# Patient Record
Sex: Female | Born: 1975 | Race: White | Hispanic: No | Marital: Married | Smoking: Former smoker
Health system: Southern US, Community
[De-identification: ages and names within clinical notes are randomized; demographics above are authoritative.]

## PROBLEM LIST (undated history)

## (undated) DIAGNOSIS — R011 Cardiac murmur, unspecified: Secondary | ICD-10-CM

## (undated) DIAGNOSIS — K219 Gastro-esophageal reflux disease without esophagitis: Secondary | ICD-10-CM

## (undated) DIAGNOSIS — Z803 Family history of malignant neoplasm of breast: Secondary | ICD-10-CM

## (undated) DIAGNOSIS — O24419 Gestational diabetes mellitus in pregnancy, unspecified control: Secondary | ICD-10-CM

## (undated) HISTORY — DX: Family history of malignant neoplasm of breast: Z80.3

## (undated) HISTORY — PX: WISDOM TOOTH EXTRACTION: SHX21

## (undated) HISTORY — DX: Cardiac murmur, unspecified: R01.1

## (undated) HISTORY — PX: EXPLORATORY LAPAROTOMY: SUR591

## (undated) HISTORY — PX: CHOLECYSTECTOMY: SHX55

## (undated) HISTORY — DX: Gastro-esophageal reflux disease without esophagitis: K21.9

---

## 1997-07-12 ENCOUNTER — Encounter: Admission: RE | Admit: 1997-07-12 | Discharge: 1997-07-12 | Payer: Self-pay | Admitting: Obstetrics & Gynecology

## 1999-06-25 ENCOUNTER — Ambulatory Visit (HOSPITAL_COMMUNITY): Admission: RE | Admit: 1999-06-25 | Discharge: 1999-06-25 | Payer: Self-pay | Admitting: Family Medicine

## 1999-06-25 ENCOUNTER — Encounter: Payer: Self-pay | Admitting: Family Medicine

## 2000-02-19 ENCOUNTER — Other Ambulatory Visit: Admission: RE | Admit: 2000-02-19 | Discharge: 2000-02-19 | Payer: Self-pay | Admitting: *Deleted

## 2001-03-05 ENCOUNTER — Other Ambulatory Visit: Admission: RE | Admit: 2001-03-05 | Discharge: 2001-03-05 | Payer: Self-pay | Admitting: Family Medicine

## 2002-02-08 ENCOUNTER — Encounter: Payer: Self-pay | Admitting: Emergency Medicine

## 2002-02-08 ENCOUNTER — Emergency Department (HOSPITAL_COMMUNITY): Admission: EM | Admit: 2002-02-08 | Discharge: 2002-02-08 | Payer: Self-pay | Admitting: Emergency Medicine

## 2002-03-09 ENCOUNTER — Other Ambulatory Visit: Admission: RE | Admit: 2002-03-09 | Discharge: 2002-03-09 | Payer: Self-pay | Admitting: Family Medicine

## 2002-03-16 ENCOUNTER — Ambulatory Visit (HOSPITAL_COMMUNITY): Admission: RE | Admit: 2002-03-16 | Discharge: 2002-03-16 | Payer: Self-pay | Admitting: Gastroenterology

## 2002-03-29 ENCOUNTER — Ambulatory Visit (HOSPITAL_COMMUNITY): Admission: RE | Admit: 2002-03-29 | Discharge: 2002-03-29 | Payer: Self-pay | Admitting: Gastroenterology

## 2002-03-29 ENCOUNTER — Encounter: Payer: Self-pay | Admitting: Gastroenterology

## 2002-04-12 ENCOUNTER — Emergency Department (HOSPITAL_COMMUNITY): Admission: EM | Admit: 2002-04-12 | Discharge: 2002-04-12 | Payer: Self-pay

## 2003-04-08 ENCOUNTER — Other Ambulatory Visit: Admission: RE | Admit: 2003-04-08 | Discharge: 2003-04-08 | Payer: Self-pay | Admitting: Family Medicine

## 2003-07-12 ENCOUNTER — Other Ambulatory Visit: Admission: RE | Admit: 2003-07-12 | Discharge: 2003-07-12 | Payer: Self-pay | Admitting: Family Medicine

## 2004-04-16 ENCOUNTER — Ambulatory Visit (HOSPITAL_COMMUNITY): Admission: RE | Admit: 2004-04-16 | Discharge: 2004-04-16 | Payer: Self-pay | Admitting: Obstetrics

## 2004-05-13 ENCOUNTER — Emergency Department (HOSPITAL_COMMUNITY): Admission: EM | Admit: 2004-05-13 | Discharge: 2004-05-14 | Payer: Self-pay | Admitting: Emergency Medicine

## 2005-03-26 ENCOUNTER — Ambulatory Visit (HOSPITAL_COMMUNITY): Admission: RE | Admit: 2005-03-26 | Discharge: 2005-03-26 | Payer: Self-pay | Admitting: Gastroenterology

## 2005-10-19 ENCOUNTER — Emergency Department (HOSPITAL_COMMUNITY): Admission: EM | Admit: 2005-10-19 | Discharge: 2005-10-19 | Payer: Self-pay | Admitting: Emergency Medicine

## 2005-12-03 ENCOUNTER — Emergency Department (HOSPITAL_COMMUNITY): Admission: EM | Admit: 2005-12-03 | Discharge: 2005-12-03 | Payer: Self-pay | Admitting: Family Medicine

## 2006-04-22 ENCOUNTER — Emergency Department (HOSPITAL_COMMUNITY): Admission: EM | Admit: 2006-04-22 | Discharge: 2006-04-22 | Payer: Self-pay | Admitting: Family Medicine

## 2006-06-17 ENCOUNTER — Emergency Department (HOSPITAL_COMMUNITY): Admission: EM | Admit: 2006-06-17 | Discharge: 2006-06-17 | Payer: Self-pay | Admitting: Family Medicine

## 2006-10-29 ENCOUNTER — Emergency Department (HOSPITAL_COMMUNITY): Admission: EM | Admit: 2006-10-29 | Discharge: 2006-10-29 | Payer: Self-pay | Admitting: Family Medicine

## 2006-11-12 ENCOUNTER — Ambulatory Visit (HOSPITAL_COMMUNITY): Admission: RE | Admit: 2006-11-12 | Discharge: 2006-11-12 | Payer: Self-pay | Admitting: Gastroenterology

## 2006-12-05 ENCOUNTER — Ambulatory Visit (HOSPITAL_COMMUNITY): Admission: RE | Admit: 2006-12-05 | Discharge: 2006-12-05 | Payer: Self-pay | Admitting: General Surgery

## 2006-12-05 ENCOUNTER — Encounter (INDEPENDENT_AMBULATORY_CARE_PROVIDER_SITE_OTHER): Payer: Self-pay | Admitting: General Surgery

## 2006-12-30 ENCOUNTER — Ambulatory Visit (HOSPITAL_COMMUNITY): Admission: RE | Admit: 2006-12-30 | Discharge: 2006-12-30 | Payer: Self-pay | Admitting: Gynecology

## 2008-02-09 ENCOUNTER — Other Ambulatory Visit: Admission: RE | Admit: 2008-02-09 | Discharge: 2008-02-09 | Payer: Self-pay | Admitting: Gynecology

## 2009-02-01 ENCOUNTER — Emergency Department (HOSPITAL_COMMUNITY): Admission: EM | Admit: 2009-02-01 | Discharge: 2009-02-01 | Payer: Self-pay | Admitting: Family Medicine

## 2010-06-19 NOTE — Op Note (Signed)
NAMECHABELI, Tiffany Paul NO.:  [id number]   MEDICAL RECORD NO.:  [id number]          PATIENT TYPE:  AMB   LOCATION:  DAY                          FACILITY:  [facility]   PHYSICIAN:  Lennie Muckle, MD      DATE OF BIRTH:  31-Dec-1975   DATE OF PROCEDURE:  12/05/2006  DATE OF DISCHARGE:                               OPERATIVE REPORT   PROCEDURE:  Laparoscopic cholecystectomy.   SURGEON:  Lennie Muckle, M.D.   ASSISTANT:  Adolph Pollack, M.D.   ANESTHESIA:  General endotracheal.   SPECIMENS:  Gallbladder.   COMPLICATIONS:  None immediate.   ESTIMATED BLOOD LOSS:  Minimal.   INDICATIONS FOR PROCEDURE:  The patient is a 35 year old female who had  epigastric discomfort which was associated with eating and also had  associated nausea and vomiting.  Ultrasound revealed gallstones and the  diagnosis of symptomatic cholelithiasis was obtained.  Informed consent  was obtained for a laparoscopic cholecystectomy.   DESCRIPTION OF PROCEDURE:  The patient was identified in the  preoperative holding area, taken back to the operating room, where she  was placed in supine position.  She was given IV antibiotics with Cipro  prior to the procedure.  After administration of general endotracheal  anesthesia her abdomen was prepped and draped in the usual sterile  fashion.  A timeout procedure was performed indicating the patient and  the procedure.  The supraumbilical area skin was anesthetized with 0.25%  Marcaine as well as 1% lidocaine mixture.  Incision placed.  The fascia  was grasped with a Kocher.  The Veress needle was placed into the  abdominal cavity to obtain pneumoperitoneum.  Once pneumoperitoneum was  established, an 11 mm trocar was placed in the supraumbilical incision  site under direct visualization with the OptiVu.  The abdominal wall  layers of the rectus muscle, and the fascia anterior and posterior were  identified.  Once inside the abdominal cavity, it was  evident that there  was no evidence of injury to the small bowel.  A 5 mm trocar was placed  in the epigastric region and two were placed along the right  costochondral margin under visualization of the camera.  The fundus of  the gallbladder was retracted toward the head of the patient.  The  infundibulum was grasped away from the liver bed and the peritoneum  dissected with the [place] forceps.  Using both electrocautery and the  [place] forceps, the cystic duct was able to be fully identified.  A  critical view was obtained of the liver bed as well as the cystic duct.  Three clips were placed proximally and one distally on the cystic duct.  This was then transected with laparoscopic scissors.  The cystic artery  was identified medially along the gallbladder.  Two clips were placed  proximally on the cystic artery, one distally.  This was also transected  and the remaining peritoneal attachments were taken with the hook  electrocautery.  Electrocautery was applied to the liver bed for  hemostasis.  The abdomen was irrigated with saline.  There was  no  evidence of bleeding at the end of the case and no biliary drainage at  the end of the case.  The specimen was  removed in the EndoCatch bag from the supraumbilical incision.  The  fascia at the supraumbilical incision was closed with 0 Vicryl sutures.  Skin was closed with 4-0 Monocryl.  Steri-Strips were placed as a final  dressing.  The patient was then extubated and transported to the  postanesthesia care unit in stable condition.      Lennie Muckle, MD  Electronically Signed     ALA/MEDQ  D:  12/05/2006  T:  12/06/2006  Job:  [id number]

## 2010-06-19 NOTE — Op Note (Signed)
Tiffany Paul, Tiffany Paul               ACCOUNT NO.:  [id number]   MEDICAL RECORD NO.:  [id number]          PATIENT TYPE:  AMB   LOCATION:  ENDO                         FACILITY:  [facility]   PHYSICIAN:  John C. Madilyn Fireman, M.D.    DATE OF BIRTH:  Jun 30, 1975   DATE OF PROCEDURE:  11/12/2006  DATE OF DISCHARGE:                               OPERATIVE REPORT   PROCEDURE:  Esophagogastroduodenoscopy.   INDICATIONS FOR PROCEDURE:  Epigastric abdominal pain.   PROCEDURE:  The patient was placed in the left lateral decubitus  position and placed on the pulse monitor with continuous low-flow oxygen  delivered by nasal cannula.  She was sedated with 100 mcg IV fentanyl  and 10 mg IV Versed.  The Olympus video endoscope was advanced under  direct vision into the oropharynx and esophagus.  Esophagus was straight  and of normal caliber with the squamocolumnar line at 38 cm.  There was  no visible hiatal hernia, ring, stricture or other abnormality of the GE  junction.  The stomach was entered and a small amount of liquid  secretions were suctioned from the fundus.  Retroflexed view of the  cardia was unremarkable.  The fundus, body and the pylorus all appeared  normal.  The duodenum was entered and  both bulb and second portion were  well inspected and appeared to be within normal limits.  The scope was  then withdrawn and the patient returned to the recovery room in stable  condition.  He tolerated the procedure well.  There were no immediate  complications.   IMPRESSION:  Normal study.   PLAN:  Will proceed with gallbladder ultrasound.           ______________________________  Everardo All Madilyn Fireman, M.D.     JCH/MEDQ  D:  11/12/2006  T:  11/13/2006  Job:  [id number]   cc:   Leatha Gilding. Mezer, M.D.  Fax: [phone]

## 2010-06-22 NOTE — Op Note (Signed)
   [name], Tiffany Paul                          ACCOUNT NO.:  [id number]   MEDICAL RECORD NO.:  [id number]                   PATIENT TYPE:  OUT   LOCATION:  ENDO                                 FACILITY:  [facility]   PHYSICIAN:  John C. Madilyn Fireman, M.D.                 DATE OF BIRTH:  [birth year]   DATE OF PROCEDURE:  03/16/2002  DATE OF DISCHARGE:                                 OPERATIVE REPORT   PROCEDURE:  Esophagogastroduodenoscopy.   INDICATIONS FOR PROCEDURE:  Continued epigastric abdominal pain with  question of a biliary source based on mildly thickened gallbladder wall on a  previous ultrasound. Subsequent PIPIDA scan was unrevealing. She has had no  consistent response to the proton pump inhibitors. The procedure is to  assess for any acid peptic disease, significant hiatal hernia or  helicobacter infection possibly contributing to her symptoms.   DESCRIPTION OF PROCEDURE:  The patient was placed in the left lateral  decubitus position then placed on the pulse monitor with continuous low flow  oxygen delivered by nasal cannula. She was sedated with 75 mcg IV fentanyl  and 9 mg IV Versed. The Olympus video endoscope was advanced under direct  vision into the oropharynx and esophagus. The esophagus was straight and of  normal caliber with the squamocolumnar line at 38 cm. There was no visible  hiatal hernia, ring, stricture or other abnormality of the GE junction. The  stomach was entered and a small amount of liquid secretions were suctioned  from the fundus. Retroflexed view of the cardia was unremarkable. The  fundus, body, antrum and pylorus all appeared normal. The duodenum was  entered and both the bulb and second portion were well inspected and  appeared to be within normal limits. The scope was withdrawn back into the  stomach and a CLOtest obtained. The scope was then withdrawn and the patient  returned to the recovery room in stable condition. She tolerated the  procedure  well and there were no immediate complications.   IMPRESSION:  Normal endoscopy.   PLAN:  Await CLOtest, otherwise, if future abdominal pain attacks occur  consider repeat ultrasound or CCK stimulated PIPIDA scan.                                               John C. Madilyn Fireman, M.D.    JCH/MEDQ  D:  03/16/2002  T:  03/16/2002  Job:  [id number]

## 2010-10-26 LAB — RPR: RPR: NONREACTIVE

## 2010-10-26 LAB — GC/CHLAMYDIA PROBE AMP, GENITAL
Chlamydia: NEGATIVE
Gonorrhea: NEGATIVE

## 2010-10-26 LAB — HEPATITIS B SURFACE ANTIGEN: Hepatitis B Surface Ag: NEGATIVE

## 2010-10-26 LAB — HIV ANTIBODY (ROUTINE TESTING W REFLEX): HIV: NONREACTIVE

## 2010-10-26 LAB — ANTIBODY SCREEN: Antibody Screen: NEGATIVE

## 2010-10-26 LAB — RUBELLA ANTIBODY, IGM: Rubella: IMMUNE

## 2010-11-12 ENCOUNTER — Encounter (HOSPITAL_COMMUNITY): Payer: Self-pay | Admitting: *Deleted

## 2010-11-14 LAB — HEMOGLOBIN AND HEMATOCRIT, BLOOD
HCT: 37.4
Hemoglobin: 12.9

## 2010-11-14 LAB — PREGNANCY, URINE: Preg Test, Ur: NEGATIVE

## 2010-11-15 LAB — COMPREHENSIVE METABOLIC PANEL
Alkaline Phosphatase: 64
BUN: 15
GFR calc Af Amer: >60
GFR calc non Af Amer: >60
Total Bilirubin: 0.9

## 2010-11-15 LAB — POCT PREGNANCY, URINE
Operator id: 247071
Preg Test, Ur: NEGATIVE

## 2010-11-15 LAB — DIFFERENTIAL
Basophils Absolute: 0.2 — ABNORMAL HIGH
Basophils Relative: 2 — ABNORMAL HIGH
Eosinophils Relative: 1
Lymphs Abs: 3.2
Neutro Abs: 5.8
Neutrophils Relative %: 59

## 2010-11-15 LAB — CBC
HCT: 41.9
MCHC: 34
MCV: 86.9
RBC: 4.82
RDW: 12.9

## 2010-11-20 ENCOUNTER — Encounter (HOSPITAL_COMMUNITY): Payer: Self-pay | Admitting: *Deleted

## 2011-03-13 ENCOUNTER — Encounter: Payer: BC Managed Care – PPO | Attending: Obstetrics and Gynecology | Admitting: Dietician

## 2011-03-13 DIAGNOSIS — Z713 Dietary counseling and surveillance: Secondary | ICD-10-CM | POA: Insufficient documentation

## 2011-03-13 DIAGNOSIS — O99810 Abnormal glucose complicating pregnancy: Secondary | ICD-10-CM | POA: Insufficient documentation

## 2011-03-13 NOTE — Patient Instructions (Signed)
Goals:  Check glucose levels per MD as instructed  Follow Gestational Diabetes Diet as instructed  Call for follow-up as needed    

## 2011-03-13 NOTE — Progress Notes (Signed)
  Patient was seen on 03/13/2011 for Gestational Diabetes self-management class at the [facility]. The following learning objectives were met by the patient during this course:   States the definition of Gestational Diabetes  States why dietary management is important in controlling blood glucose  Describes the effects each nutrient has on blood glucose levels  Demonstrates ability to create a balanced meal plan  Demonstrates carbohydrate counting   States when to check blood glucose levels  Demonstrates proper blood glucose monitoring techniques  States the effect of stress and exercise on blood glucose levels  States the importance of limiting caffeine and abstaining from alcohol and smoking  Blood glucose monitor given: Accu-Chek Smartview Meter Kit  (Accu-Chek Smartview strips and Accu-Chek Fast Clix lancets) Lot # [id number] Exp: 06/03/2012 Blood glucose reading: 78  Patient instructed to monitor glucose levels: Fasting and 2 hours after first bite of each meal. FBS: 60 - <90 2 hour: <120  Patient received handouts:  Nutrition Diabetes and Pregnancy  Carbohydrate Counting List  Patient will be seen for follow-up as needed.

## 2011-04-24 LAB — STREP B DNA PROBE: GBS: NEGATIVE

## 2011-05-03 ENCOUNTER — Encounter (HOSPITAL_COMMUNITY): Payer: Self-pay | Admitting: *Deleted

## 2011-05-03 ENCOUNTER — Inpatient Hospital Stay (HOSPITAL_COMMUNITY)
Admission: AD | Admit: 2011-05-03 | Discharge: 2011-05-03 | Disposition: A | Discharge status: Home / Self Care | Payer: BC Managed Care – PPO | Source: Ambulatory Visit | Attending: Obstetrics and Gynecology | Admitting: Obstetrics and Gynecology

## 2011-05-03 DIAGNOSIS — O479 False labor, unspecified: Secondary | ICD-10-CM | POA: Insufficient documentation

## 2011-05-03 NOTE — Discharge Instructions (Signed)
Pregnancy - Third Trimester The third trimester of pregnancy (the last 3 months) is a period of the most rapid growth for you and your baby. The baby approaches a length of 20 inches and a weight of 6 to 10 pounds. The baby is adding on fat and getting ready for life outside your body. While inside, babies have periods of sleeping and waking, suck their thumbs, and hiccups. You can often feel small contractions of the uterus. This is false labor. It is also called Braxton-Hicks contractions. This is like a practice for labor. The usual problems in this stage of pregnancy include more difficulty breathing, swelling of the hands and feet from water retention, and having to urinate more often because of the uterus and baby pressing on your bladder.  PRENATAL EXAMS  Blood work may continue to be done during prenatal exams. These tests are done to check on your health and the probable health of your baby. Blood work is used to follow your blood levels (hemoglobin). Anemia (low hemoglobin) is common during pregnancy. Iron and vitamins are given to help prevent this. You may also continue to be checked for diabetes. Some of the past blood tests may be done again.   The size of the uterus is measured during each visit. This makes sure your baby is growing properly according to your pregnancy dates.   Your blood pressure is checked every prenatal visit. This is to make sure you are not getting toxemia.   Your urine is checked every prenatal visit for infection, diabetes and protein.   Your weight is checked at each visit. This is done to make sure gains are happening at the suggested rate and that you and your baby are growing normally.   Sometimes, an ultrasound is performed to confirm the position and the proper growth and development of the baby. This is a test done that bounces harmless sound waves off the baby so your caregiver can more accurately determine due dates.   Discuss the type of pain  medication and anesthesia you will have during your labor and delivery.   Discuss the possibility and anesthesia if a Cesarean Section might be necessary.   Inform your caregiver if there is any mental or physical violence at home.  Sometimes, a specialized non-stress test, contraction stress test and biophysical profile are done to make sure the baby is not having a problem. Checking the amniotic fluid surrounding the baby is called an amniocentesis. The amniotic fluid is removed by sticking a needle into the belly (abdomen). This is sometimes done near the end of pregnancy if an early delivery is required. In this case, it is done to help make sure the baby's lungs are mature enough for the baby to live outside of the womb. If the lungs are not mature and it is unsafe to deliver the baby, an injection of cortisone medication is given to the mother 1 to 2 days before the delivery. This helps the baby's lungs mature and makes it safer to deliver the baby. CHANGES OCCURING IN THE THIRD TRIMESTER OF PREGNANCY Your body goes through many changes during pregnancy. They vary from person to person. Talk to your caregiver about changes you notice and are concerned about.  During the last trimester, you have probably had an increase in your appetite. It is normal to have cravings for certain foods. This varies from person to person and pregnancy to pregnancy.   You may begin to get stretch marks on your hips,   abdomen, and breasts. These are normal changes in the body during pregnancy. There are no exercises or medications to take which prevent this change.   Constipation may be treated with a stool softener or adding bulk to your diet. Drinking lots of fluids, fiber in vegetables, fruits, and whole grains are helpful.   Exercising is also helpful. If you have been very active up until your pregnancy, most of these activities can be continued during your pregnancy. If you have been less active, it is helpful  to start an exercise program such as walking. Consult your caregiver before starting exercise programs.   Avoid all smoking, alcohol, un-prescribed drugs, herbs and "street drugs" during your pregnancy. These chemicals affect the formation and growth of the baby. Avoid chemicals throughout the pregnancy to ensure the delivery of a healthy infant.   Backache, varicose veins and hemorrhoids may develop or get worse.   You will tire more easily in the third trimester, which is normal.   The baby's movements may be stronger and more often.   You may become short of breath easily.   Your belly button may stick out.   A yellow discharge may leak from your breasts called colostrum.   You may have a bloody mucus discharge. This usually occurs a few days to a week before labor begins.  HOME CARE INSTRUCTIONS   Keep your caregiver's appointments. Follow your caregiver's instructions regarding medication use, exercise, and diet.   During pregnancy, you are providing food for you and your baby. Continue to eat regular, well-balanced meals. Choose foods such as meat, fish, milk and other low fat dairy products, vegetables, fruits, and whole-grain breads and cereals. Your caregiver will tell you of the ideal weight gain.   A physical sexual relationship may be continued throughout pregnancy if there are no other problems such as early (premature) leaking of amniotic fluid from the membranes, vaginal bleeding, or belly (abdominal) pain.   Exercise regularly if there are no restrictions. Check with your caregiver if you are unsure of the safety of your exercises. Greater weight gain will occur in the last 2 trimesters of pregnancy. Exercising helps:   Control your weight.   Get you in shape for labor and delivery.   You lose weight after you deliver.   Rest a lot with legs elevated, or as needed for leg cramps or low back pain.   Wear a good support or jogging bra for breast tenderness during  pregnancy. This may help if worn during sleep. Pads or tissues may be used in the bra if you are leaking colostrum.   Do not use hot tubs, steam rooms, or saunas.   Wear your seat belt when driving. This protects you and your baby if you are in an accident.   Avoid raw meat, cat litter boxes and soil used by cats. These carry germs that can cause birth defects in the baby.   It is easier to loose urine during pregnancy. Tightening up and strengthening the pelvic muscles will help with this problem. You can practice stopping your urination while you are going to the bathroom. These are the same muscles you need to strengthen. It is also the muscles you would use if you were trying to stop from passing gas. You can practice tightening these muscles up 10 times a set and repeating this about 3 times per day. Once you know what muscles to tighten up, do not perform these exercises during urination. It is more likely   to cause an infection by backing up the urine.   Ask for help if you have financial, counseling or nutritional needs during pregnancy. Your caregiver will be able to offer counseling for these needs as well as refer you for other special needs.   Make a list of emergency phone numbers and have them available.   Plan on getting help from family or friends when you go home from the hospital.   Make a trial run to the hospital.   Take prenatal classes with the father to understand, practice and ask questions about the labor and delivery.   Prepare the baby's room/nursery.   Do not travel out of the city unless it is absolutely necessary and with the advice of your caregiver.   Wear only low or no heal shoes to have better balance and prevent falling.  MEDICATIONS AND DRUG USE IN PREGNANCY  Take prenatal vitamins as directed. The vitamin should contain 1 milligram of folic acid. Keep all vitamins out of reach of children. Only a couple vitamins or tablets containing iron may be fatal  to a baby or young child when ingested.   Avoid use of all medications, including herbs, over-the-counter medications, not prescribed or suggested by your caregiver. Only take over-the-counter or prescription medicines for pain, discomfort, or fever as directed by your caregiver. Do not use aspirin, ibuprofen (Motrin, Advil, Nuprin) or naproxen (Aleve) unless OK'd by your caregiver.   Let your caregiver also know about herbs you may be using.   Alcohol is related to a number of birth defects. This includes fetal alcohol syndrome. All alcohol, in any form, should be avoided completely. Smoking will cause low birth rate and premature babies.   Street/illegal drugs are very harmful to the baby. They are absolutely forbidden. A baby born to an addicted mother will be addicted at birth. The baby will go through the same withdrawal an adult does.  SEEK MEDICAL CARE IF: You have any concerns or worries during your pregnancy. It is better to call with your questions if you feel they cannot wait, rather than worry about them. DECISIONS ABOUT CIRCUMCISION You may or may not know the sex of your baby. If you know your baby is a boy, it may be time to think about circumcision. Circumcision is the removal of the foreskin of the penis. This is the skin that covers the sensitive end of the penis. There is no proven medical need for this. Often this decision is made on what is popular at the time or based upon religious beliefs and social issues. You can discuss these issues with your caregiver or pediatrician. SEEK IMMEDIATE MEDICAL CARE IF:   An unexplained oral temperature above 102 F (38.9 C) develops, or as your caregiver suggests.   You have leaking of fluid from the vagina (birth canal). If leaking membranes are suspected, take your temperature and tell your caregiver of this when you call.   There is vaginal spotting, bleeding or passing clots. Tell your caregiver of the amount and how many pads are  used.   You develop a bad smelling vaginal discharge with a change in the color from clear to white.   You develop vomiting that lasts more than 24 hours.   You develop chills or fever.   You develop shortness of breath.   You develop burning on urination.   You loose more than 2 pounds of weight or gain more than 2 pounds of weight or as suggested by your   caregiver.   You notice sudden swelling of your face, hands, and feet or legs.   You develop belly (abdominal) pain. Round ligament discomfort is a common non-cancerous (benign) cause of abdominal pain in pregnancy. Your caregiver still must evaluate you.   You develop a severe headache that does not go away.   You develop visual problems, blurred or double vision.   If you have not felt your baby move for more than 1 hour. If you think the baby is not moving as much as usual, eat something with sugar in it and lie down on your left side for an hour. The baby should move at least 4 to 5 times per hour. Call right away if your baby moves less than that.   You fall, are in a car accident or any kind of trauma.   There is mental or physical violence at home.   Keep appointment already scheduled.  Call Private MD and/or return to [facility] if symptoms increase. Document Released: 01/15/2001 Document Revised: 01/10/2011 Document Reviewed: 07/20/2008 [facility] Patient Information 2012 [place], [place].

## 2011-05-03 NOTE — MAU Note (Signed)
Pt states she has been having pain all day.pain increased at about 2100

## 2011-05-03 NOTE — MAU Note (Signed)
Contractions, pressure. Denies bleeding. Gestational diabetes

## 2011-05-05 ENCOUNTER — Encounter (HOSPITAL_COMMUNITY): Payer: Self-pay | Admitting: *Deleted

## 2011-05-05 ENCOUNTER — Inpatient Hospital Stay (HOSPITAL_COMMUNITY)
Admission: AD | Admit: 2011-05-05 | Discharge: 2011-05-05 | Disposition: A | Discharge status: Hospice | Payer: BC Managed Care – PPO | Source: Ambulatory Visit | Attending: Obstetrics and Gynecology | Admitting: Obstetrics and Gynecology

## 2011-05-05 DIAGNOSIS — O479 False labor, unspecified: Secondary | ICD-10-CM | POA: Insufficient documentation

## 2011-05-05 HISTORY — DX: Gestational diabetes mellitus in pregnancy, unspecified control: O24.419

## 2011-05-05 NOTE — MAU Note (Signed)
Pt c/o contractions that have been 5-7 min apart for the past two hours and increasing in intensity.

## 2011-05-05 NOTE — MAU Note (Signed)
Pt G2 at 37.3wks having contractions and leaking small amt of clear fluid since 1500.

## 2011-05-05 NOTE — Discharge Instructions (Signed)

## 2011-05-14 ENCOUNTER — Encounter (HOSPITAL_COMMUNITY): Payer: Self-pay | Admitting: *Deleted

## 2011-05-14 ENCOUNTER — Inpatient Hospital Stay (HOSPITAL_COMMUNITY)
Admission: AD | Admit: 2011-05-14 | Discharge: 2011-05-18 | DRG: 371 | Disposition: A | Discharge status: Home / Self Care | Payer: BC Managed Care – PPO | Source: Ambulatory Visit | Attending: Obstetrics and Gynecology | Admitting: Obstetrics and Gynecology

## 2011-05-14 DIAGNOSIS — O09519 Supervision of elderly primigravida, unspecified trimester: Secondary | ICD-10-CM | POA: Diagnosis present

## 2011-05-14 DIAGNOSIS — O339 Maternal care for disproportion, unspecified: Secondary | ICD-10-CM | POA: Diagnosis present

## 2011-05-14 DIAGNOSIS — O330 Maternal care for disproportion due to deformity of maternal pelvic bones: Principal | ICD-10-CM | POA: Diagnosis present

## 2011-05-14 DIAGNOSIS — O99814 Abnormal glucose complicating childbirth: Secondary | ICD-10-CM | POA: Diagnosis present

## 2011-05-14 LAB — LACTATE DEHYDROGENASE: LDH: 206 U/L (ref 94–250)

## 2011-05-14 LAB — COMPREHENSIVE METABOLIC PANEL
Alkaline Phosphatase: 156 U/L — ABNORMAL HIGH (ref 39–117)
BUN: 14 mg/dL (ref 6–23)
Creatinine, Ser: 0.61 mg/dL (ref 0.50–1.10)
GFR calc Af Amer: >90 mL/min (ref >90)
Glucose, Bld: 68 mg/dL — ABNORMAL LOW (ref 70–99)
Potassium: 3.4 mEq/L — ABNORMAL LOW (ref 3.5–5.1)
Total Bilirubin: 0.5 mg/dL (ref 0.3–1.2)
Total Protein: 5.8 g/dL — ABNORMAL LOW (ref 6.0–8.3)

## 2011-05-14 LAB — CBC
MCV: 91 fL (ref 78.0–100.0)
Platelets: 168 10*3/uL (ref 150–400)
RBC: 4.46 MIL/uL (ref 3.87–5.11)
RDW: 13.9 % (ref 11.5–15.5)
WBC: 14.3 10*3/uL — ABNORMAL HIGH (ref 4.0–10.5)

## 2011-05-14 LAB — GLUCOSE, CAPILLARY: Glucose-Capillary: 67 mg/dL — ABNORMAL LOW (ref 70–99)

## 2011-05-14 MED ORDER — OXYCODONE-ACETAMINOPHEN 5-325 MG PO TABS: 1.0000 | ORAL_TABLET | ORAL | Status: DC | PRN

## 2011-05-14 MED ORDER — IBUPROFEN 600 MG PO TABS: 600.0000 mg | ORAL_TABLET | Freq: Four times a day (QID) | ORAL | Status: DC | PRN

## 2011-05-14 MED ORDER — ACETAMINOPHEN 325 MG PO TABS
650.0000 mg | ORAL_TABLET | ORAL | Status: DC | PRN
  Administered 2011-05-14 – 2011-05-15 (×2): 650 mg via ORAL
  Filled 2011-05-14 (×2): qty 2

## 2011-05-14 MED ORDER — FLEET ENEMA 7-19 GM/118ML RE ENEM: 1.0000 | ENEMA | RECTAL | Status: DC | PRN

## 2011-05-14 MED ORDER — LACTATED RINGERS IV SOLN: 500.0000 mL | INTRAVENOUS | Status: DC | PRN

## 2011-05-14 MED ORDER — ONDANSETRON HCL 4 MG/2ML IJ SOLN
4.0000 mg | Freq: Four times a day (QID) | INTRAMUSCULAR | Status: DC | PRN
  Administered 2011-05-15: 4 mg via INTRAVENOUS
  Filled 2011-05-14: qty 2

## 2011-05-14 MED ORDER — TERBUTALINE SULFATE 1 MG/ML IJ SOLN: 0.2500 mg | Freq: Once | INTRAMUSCULAR | Status: AC | PRN

## 2011-05-14 MED ORDER — OXYTOCIN BOLUS FROM INFUSION
500.0000 mL | Freq: Once | INTRAVENOUS | Status: DC
  Filled 2011-05-14: qty 500

## 2011-05-14 MED ORDER — LACTATED RINGERS IV SOLN
INTRAVENOUS | Status: DC
  Administered 2011-05-15 (×6): via INTRAVENOUS
  Administered 2011-05-15: 500 mL via INTRAVENOUS
  Administered 2011-05-15: 300 mL via INTRAVENOUS

## 2011-05-14 MED ORDER — CITRIC ACID-SODIUM CITRATE 334-500 MG/5ML PO SOLN
30.0000 mL | ORAL | Status: DC | PRN
  Administered 2011-05-15: 30 mL via ORAL
  Filled 2011-05-14: qty 15

## 2011-05-14 MED ORDER — OXYTOCIN 20 UNITS IN LACTATED RINGERS INFUSION - SIMPLE
1.0000 m[IU]/min | INTRAVENOUS | Status: DC
  Administered 2011-05-14: 2 m[IU]/min via INTRAVENOUS
  Filled 2011-05-14 (×2): qty 1000

## 2011-05-14 MED ORDER — OXYTOCIN 20 UNITS IN LACTATED RINGERS INFUSION - SIMPLE: 125.0000 mL/h | Freq: Once | INTRAVENOUS | Status: DC

## 2011-05-14 MED ORDER — LIDOCAINE HCL (PF) 1 % IJ SOLN: 30.0000 mL | INTRAMUSCULAR | Status: DC | PRN

## 2011-05-14 NOTE — MAU Note (Signed)
Labor admission orders entered on pt in L/D per request of Dr. Arelia Sneddon .

## 2011-05-14 NOTE — MAU Note (Signed)
Pt had ? Gush of fluid while on toilet @ 1600, pinkish fluid, continues to have leaking, no frank bleeding, some uc's.

## 2011-05-14 NOTE — MAU Note (Signed)
srom clear fluid,, bloody show, @ 1600 today

## 2011-05-14 NOTE — H&P (Signed)
Tiffany Paul is a 36 y.o. female presenting at 45.5 with srom at 38.5.  Gestational DM controlled by diet.  Neg GBS Maternal Medical History:  Reason for admission: Reason for admission: rupture of membranes.  Contractions: Onset was 1-2 hours ago.   Perceived severity is moderate.    Fetal activity: Perceived fetal activity is normal.    Prenatal Complications - Diabetes: gestational. Diabetes is managed by diet.      OB History    Grav Para Term Preterm Abortions TAB SAB Ect Mult Living   1         0     Past Medical History  Diagnosis Date  . Gestational diabetes   . Endometriosis    Past Surgical History  Procedure Date  . Cholecystectomy   . Exploratory laparotomy     For endometrosis  . Wisdom tooth extraction    Family History: family history includes Cancer in her mother and other; Diabetes in her father; Heart disease in her paternal uncle; and Hypertension in her other and paternal uncle. Social History:  reports that she has never smoked. She has never used smokeless tobacco. She reports that she does not drink alcohol or use illicit drugs.  ROS  Dilation: 3 Effacement (%): 70 Station: -2 Exam by:: Dorrene German RN Blood pressure 140/80, pulse 81, temperature 98.3 F (36.8 C), temperature source Oral, resp. rate 18, height 5\' 4"  (1.626 m), weight 82.555 kg (182 lb), last menstrual period 08/16/2010. Maternal Exam:  Uterine Assessment: Contraction strength is moderate.  Contraction frequency is regular.   Abdomen: Patient reports no abdominal tenderness. Fundal height is cw dates.   Estimated fetal weight is 7.5.   Fetal presentation: vertex  Introitus: Amniotic fluid character: clear.  Pelvis: adequate for delivery.   Cervix: Cervix evaluated by digital exam.     Physical Exam  Prenatal labs: ABO, Rh: A/Positive/-- (09/21 0000) Antibody: Negative (09/21 0000) Rubella: Immune (09/21 0000) RPR: Nonreactive (09/21 0000)  HBsAg: Negative (09/21 0000)    HIV: Non-reactive (09/21 0000)  GBS: Negative (03/20 0000)   Assessment/Plan:IUP at 38.5 with SROM.  Routine L and D  Tiffany Paul S 05/14/2011, 7:44 PM

## 2011-05-15 ENCOUNTER — Encounter (HOSPITAL_COMMUNITY): Payer: Self-pay | Admitting: Anesthesiology

## 2011-05-15 ENCOUNTER — Inpatient Hospital Stay (HOSPITAL_COMMUNITY): Payer: BC Managed Care – PPO

## 2011-05-15 ENCOUNTER — Encounter (HOSPITAL_COMMUNITY)
Admission: AD | Disposition: A | Discharge status: Home / Self Care | Payer: Self-pay | Source: Ambulatory Visit | Attending: Obstetrics and Gynecology

## 2011-05-15 ENCOUNTER — Inpatient Hospital Stay (HOSPITAL_COMMUNITY): Payer: BC Managed Care – PPO | Admitting: Anesthesiology

## 2011-05-15 LAB — RPR: RPR Ser Ql: NONREACTIVE

## 2011-05-15 SURGERY — Surgical Case
Anesthesia: Regional | Site: Abdomen | Wound class: Clean Contaminated

## 2011-05-15 MED ORDER — MORPHINE SULFATE (PF) 0.5 MG/ML IJ SOLN
INTRAMUSCULAR | Status: DC | PRN
  Administered 2011-05-15: 2 mg via EPIDURAL

## 2011-05-15 MED ORDER — MORPHINE SULFATE (PF) 0.5 MG/ML IJ SOLN
INTRAMUSCULAR | Status: DC | PRN
  Administered 2011-05-15: 3 mg via EPIDURAL

## 2011-05-15 MED ORDER — EPHEDRINE 5 MG/ML INJ: 10.0000 mg | INTRAVENOUS | Status: DC | PRN

## 2011-05-15 MED ORDER — MORPHINE SULFATE 0.5 MG/ML IJ SOLN
INTRAMUSCULAR | Status: AC
  Filled 2011-05-15: qty 10

## 2011-05-15 MED ORDER — FENTANYL 2.5 MCG/ML BUPIVACAINE 1/10 % EPIDURAL INFUSION (WH - ANES)
INTRAMUSCULAR | Status: DC | PRN
  Administered 2011-05-15: 14 mL/h via EPIDURAL

## 2011-05-15 MED ORDER — LIDOCAINE-EPINEPHRINE (PF) 2 %-1:200000 IJ SOLN
INTRAMUSCULAR | Status: AC
  Filled 2011-05-15: qty 20

## 2011-05-15 MED ORDER — LACTATED RINGERS IV SOLN: 500.0000 mL | Freq: Once | INTRAVENOUS | Status: DC

## 2011-05-15 MED ORDER — DIPHENHYDRAMINE HCL 50 MG/ML IJ SOLN: 12.5000 mg | INTRAMUSCULAR | Status: DC | PRN

## 2011-05-15 MED ORDER — OXYTOCIN 10 UNIT/ML IJ SOLN
INTRAMUSCULAR | Status: AC
  Filled 2011-05-15: qty 2

## 2011-05-15 MED ORDER — ONDANSETRON HCL 4 MG/2ML IJ SOLN
INTRAMUSCULAR | Status: DC | PRN
  Administered 2011-05-15: 4 mg via INTRAVENOUS

## 2011-05-15 MED ORDER — PHENYLEPHRINE 40 MCG/ML (10ML) SYRINGE FOR IV PUSH (FOR BLOOD PRESSURE SUPPORT)
PREFILLED_SYRINGE | INTRAVENOUS | Status: AC
  Filled 2011-05-15: qty 5

## 2011-05-15 MED ORDER — PHENYLEPHRINE 40 MCG/ML (10ML) SYRINGE FOR IV PUSH (FOR BLOOD PRESSURE SUPPORT)
80.0000 ug | PREFILLED_SYRINGE | INTRAVENOUS | Status: DC | PRN
  Filled 2011-05-15: qty 5

## 2011-05-15 MED ORDER — PHENYLEPHRINE 40 MCG/ML (10ML) SYRINGE FOR IV PUSH (FOR BLOOD PRESSURE SUPPORT): 80.0000 ug | PREFILLED_SYRINGE | INTRAVENOUS | Status: DC | PRN

## 2011-05-15 MED ORDER — MEPERIDINE HCL 25 MG/ML IJ SOLN
INTRAMUSCULAR | Status: DC | PRN
  Administered 2011-05-15: 25 mg via INTRAVENOUS

## 2011-05-15 MED ORDER — MEPERIDINE HCL 25 MG/ML IJ SOLN
INTRAMUSCULAR | Status: AC
  Filled 2011-05-15: qty 1

## 2011-05-15 MED ORDER — CEFAZOLIN SODIUM 1-5 GM-% IV SOLN
1.0000 g | Freq: Four times a day (QID) | INTRAVENOUS | Status: DC
  Administered 2011-05-15 – 2011-05-17 (×6): 1 g via INTRAVENOUS
  Filled 2011-05-15 (×9): qty 50

## 2011-05-15 MED ORDER — SODIUM BICARBONATE 8.4 % IV SOLN
INTRAVENOUS | Status: DC | PRN
  Administered 2011-05-15: 5 mL via EPIDURAL

## 2011-05-15 MED ORDER — OXYTOCIN 20 UNITS IN LACTATED RINGERS INFUSION - SIMPLE
INTRAVENOUS | Status: DC | PRN
  Administered 2011-05-15 (×2): 20 [IU] via INTRAVENOUS

## 2011-05-15 MED ORDER — EPHEDRINE 5 MG/ML INJ
10.0000 mg | INTRAVENOUS | Status: DC | PRN
  Filled 2011-05-15: qty 4

## 2011-05-15 MED ORDER — EPHEDRINE SULFATE 50 MG/ML IJ SOLN
INTRAMUSCULAR | Status: DC | PRN
  Administered 2011-05-15 (×2): 10 mg via INTRAVENOUS

## 2011-05-15 MED ORDER — FENTANYL 2.5 MCG/ML BUPIVACAINE 1/10 % EPIDURAL INFUSION (WH - ANES)
14.0000 mL/h | INTRAMUSCULAR | Status: DC
  Administered 2011-05-15 (×4): 14 mL/h via EPIDURAL
  Filled 2011-05-15 (×6): qty 60

## 2011-05-15 MED ORDER — ONDANSETRON HCL 4 MG/2ML IJ SOLN
INTRAMUSCULAR | Status: AC
  Filled 2011-05-15: qty 2

## 2011-05-15 MED ORDER — LIDOCAINE HCL (PF) 1 % IJ SOLN
INTRAMUSCULAR | Status: DC | PRN
  Administered 2011-05-15 (×2): 4 mL

## 2011-05-15 MED ORDER — 0.9 % SODIUM CHLORIDE (POUR BTL) OPTIME
TOPICAL | Status: DC | PRN
  Administered 2011-05-15: 1000 mL

## 2011-05-15 MED ORDER — EPHEDRINE 5 MG/ML INJ
INTRAVENOUS | Status: AC
  Filled 2011-05-15: qty 10

## 2011-05-15 SURGICAL SUPPLY — 30 items
CLOTH BEACON ORANGE TIMEOUT ST (SAFETY) ×2 IMPLANT
DRESSING TELFA 8X3 (GAUZE/BANDAGES/DRESSINGS) IMPLANT
DRSG COVADERM 4X10 (GAUZE/BANDAGES/DRESSINGS) ×1 IMPLANT
ELECT REM PT RETURN 9FT ADLT (ELECTROSURGICAL) ×2
ELECTRODE REM PT RTRN 9FT ADLT (ELECTROSURGICAL) ×1 IMPLANT
EXTRACTOR VACUUM M CUP 4 TUBE (SUCTIONS) IMPLANT
GAUZE SPONGE 4X4 12PLY STRL LF (GAUZE/BANDAGES/DRESSINGS) ×4 IMPLANT
GLOVE BIO SURGEON STRL SZ7 (GLOVE) ×4 IMPLANT
GLOVE SKINSENSE NS SZ7.5 (GLOVE) ×1
GLOVE SKINSENSE NS SZ8.0 LF (GLOVE) ×1
GLOVE SKINSENSE STRL SZ6.0 (GLOVE) ×1 IMPLANT
GLOVE SKINSENSE STRL SZ7.5 (GLOVE) IMPLANT
GLOVE SKINSENSE STRL SZ8.0 LF (GLOVE) IMPLANT
GOWN PREVENTION PLUS LG XLONG (DISPOSABLE) ×6 IMPLANT
KIT ABG SYR 3ML LUER SLIP (SYRINGE) IMPLANT
NDL HYPO 25X5/8 SAFETYGLIDE (NEEDLE) ×1 IMPLANT
NEEDLE HYPO 25X5/8 SAFETYGLIDE (NEEDLE) ×2 IMPLANT
NS IRRIG 1000ML POUR BTL (IV SOLUTION) ×2 IMPLANT
PACK C SECTION WH (CUSTOM PROCEDURE TRAY) ×2 IMPLANT
PAD ABD 7.5X8 STRL (GAUZE/BANDAGES/DRESSINGS) IMPLANT
SLEEVE SCD COMPRESS KNEE MED (MISCELLANEOUS) ×1 IMPLANT
STRIP CLOSURE SKIN 1/2X4 (GAUZE/BANDAGES/DRESSINGS) ×1 IMPLANT
SUT CHROMIC 0 CTX 36 (SUTURE) ×7 IMPLANT
SUT MON AB 4-0 PS1 27 (SUTURE) ×2 IMPLANT
SUT PDS AB 0 CT1 27 (SUTURE) ×4 IMPLANT
SUT VIC AB 3-0 CT1 27 (SUTURE) ×4
SUT VIC AB 3-0 CT1 TAPERPNT 27 (SUTURE) ×2 IMPLANT
TOWEL OR 17X24 6PK STRL BLUE (TOWEL DISPOSABLE) ×4 IMPLANT
TRAY FOLEY CATH 14FR (SET/KITS/TRAYS/PACK) ×2 IMPLANT
WATER STERILE IRR 1000ML POUR (IV SOLUTION) ×1 IMPLANT

## 2011-05-15 NOTE — Op Note (Signed)
Preoperative diagnosis: CPD, failure to progress  Postoperative diagnosis: Same  Procedure: Primary low transverse cesarean section  Surgeon: Marcelle Overlie  EBL: 700 cc  Specimens removed: Placenta to pathology, cord blood collected by circulating nurse for commercial blood banking  Drains: Foley catheter   Procedure and findings:  Patient taken the operating room after an adequate level of epidural anesthesia was obtained with the patient in left tilt position the abdomen prepped and draped in the usual fashion for cesarean section. Foley catheter was arty positioned draining clear urine. She had a late first stage temperature elevation so was arty on IV Ancef. Transverse Pfannenstiel incision made 2 finger breaths above the symphysis carried down the fats which was incised and extended transversely. Rectus muscle divided in the midline peritoneum entered superiorly without incident and extended in a vertical fashion. The vesicouterine serosa was incised and the bladder was bluntly and sharply dissected below, bladder blade repositioned at that point. Transverse incision made in the lower segment extended with blunt dissection lower segment was fairly thin clear fluid noted the patient delivered of a healthy female Apgars 9 and 9 the infant was suctioned cord clamped and passed the pediatric team for further care the placenta was then delivered manually intact, sent to pathology uterus exteriorized cavity wiped clean with laparotomy pack closure obtained the first layer of 0 chromic in a locked fashion followed by an imbricating layer of chromic. This was hemostatic the bladder flap area was intact and hemostatic a lateral tubes and ovaries unremarkable. Prior to closure sponge denies precast reported prescription x2. Enclose a running 2-0 Vicryl suture. 2-0 Vicryl interrupted sutures used to close the rectus muscles in the midline a 0 PDS suture was then used to close the fascia transversely from laterally  to midline on either side subcutaneous tissue was irrigated noted be hemostatic 4-0 Monocryl subcutaneous for closure with sterile dressing applied she tolerated this well went to recovery room in good condition Dictated with [facility] [employer] Elchanan Bob M. Milana Obey.D.

## 2011-05-15 NOTE — Anesthesia Procedure Notes (Signed)
Epidural Patient location during procedure: OB Start time: 05/15/2011 1:46 AM  Staffing Anesthesiologist: Kindel Rochefort A. Performed by: anesthesiologist   Preanesthetic Checklist Completed: patient identified, site marked, surgical consent, pre-op evaluation, timeout performed, IV checked, risks and benefits discussed and monitors and equipment checked  Epidural Patient position: sitting Prep: site prepped and draped and DuraPrep Patient monitoring: continuous pulse ox and blood pressure Approach: midline Injection technique: LOR air  Needle:  Needle type: Tuohy  Needle gauge: 17 G Needle length: 9 cm Needle insertion depth: 4 cm Catheter type: closed end flexible Catheter size: 19 Gauge Catheter at skin depth: 9 cm Test dose: negative and Other  Assessment Events: blood not aspirated, injection not painful, no injection resistance, negative IV test and no paresthesia  Additional Notes Patient identified. Risks and benefits discussed including failed block, incomplete  Pain control, post dural puncture headache, nerve damage, paralysis, blood pressure Changes, nausea, vomiting, reactions to medications-both toxic and allergic and post Partum back pain. All questions were answered. Patient expressed understanding and wished to proceed. Sterile technique was used throughout procedure. Epidural site was Dressed with sterile barrier dressing. No paresthesias, signs of intravascular injection Or signs of intrathecal spread were encountered.  Patient was more comfortable after the epidural was dosed. Please see RN's note for documentation of vital signs and FHR which are stable.

## 2011-05-15 NOTE — Progress Notes (Signed)
Patient ID: Tiffany Paul, female   DOB: [birth year], 36 y.o.   MRN: [id number]  Now 4-5/C/AROM>>clr AF, stable FHR, comft with epid

## 2011-05-15 NOTE — Anesthesia Preprocedure Evaluation (Addendum)
Anesthesia Evaluation  Patient identified by MRN, date of birth, ID band Patient awake    Reviewed: Allergy & Precautions, H&P , Patient's Chart, lab work & pertinent test results  Airway Mallampati: II TM Distance: >3 FB Neck ROM: full    Dental No notable dental hx. (+) Teeth Intact   Pulmonary neg pulmonary ROS,  breath sounds clear to auscultation  Pulmonary exam normal       Cardiovascular negative cardio ROS  Rhythm:regular Rate:Normal     Neuro/Psych negative neurological ROS  negative psych ROS   GI/Hepatic negative GI ROS, Neg liver ROS,   Endo/Other  Diabetes mellitus-, Well Controlled, Gestational  Renal/GU negative Renal ROS  negative genitourinary   Musculoskeletal   Abdominal Normal abdominal exam  (+)   Peds  Hematology negative hematology ROS (+)   Anesthesia Other Findings   Reproductive/Obstetrics (+) Pregnancy                           Anesthesia Physical Anesthesia Plan  ASA: II and Emergent  Anesthesia Plan: Epidural   Post-op Pain Management:    Induction:   Airway Management Planned:   Additional Equipment:   Intra-op Plan:   Post-operative Plan:   Informed Consent: I have reviewed the patients History and Physical, chart, labs and discussed the procedure including the risks, benefits and alternatives for the proposed anesthesia with the patient or authorized representative who has indicated his/her understanding and acceptance.     Plan Discussed with: Anesthesiologist and Surgeon  Anesthesia Plan Comments:        Anesthesia Quick Evaluation

## 2011-05-15 NOTE — Transfer of Care (Signed)
Immediate Anesthesia Transfer of Care Note  Patient: Tiffany Paul  Procedure(s) Performed: Procedure(s) (LRB): CESAREAN SECTION (N/A)  Patient Location: PACU  Anesthesia Type: Epidural  Level of Consciousness: awake, alert  and oriented  Airway & Oxygen Therapy: Patient Spontanous Breathing  Post-op Assessment: Report given to PACU RN and Post -op Vital signs reviewed and stable  Post vital signs: Reviewed and stable  Complications: No apparent anesthesia complications

## 2011-05-15 NOTE — Progress Notes (Signed)
Patient ID: Tiffany Paul, female   DOB: 1975-09-19, 36 y.o.   MRN: [id number] NOw om 20 mu/min with stable FHR, IUPC placed, cx 7-8/90%/-2

## 2011-05-16 ENCOUNTER — Encounter (HOSPITAL_COMMUNITY): Payer: Self-pay | Admitting: *Deleted

## 2011-05-16 LAB — CBC
HCT: 32.2 % — ABNORMAL LOW (ref 36.0–46.0)
MCH: 31.4 pg (ref 26.0–34.0)
MCV: 92 fL (ref 78.0–100.0)
Platelets: 154 10*3/uL (ref 150–400)
RBC: 3.5 MIL/uL — ABNORMAL LOW (ref 3.87–5.11)

## 2011-05-16 MED ORDER — SODIUM CHLORIDE 0.9 % IJ SOLN: 3.0000 mL | INTRAMUSCULAR | Status: DC | PRN

## 2011-05-16 MED ORDER — ONDANSETRON HCL 4 MG/2ML IJ SOLN: 4.0000 mg | Freq: Three times a day (TID) | INTRAMUSCULAR | Status: DC | PRN

## 2011-05-16 MED ORDER — BISACODYL 10 MG RE SUPP: 10.0000 mg | Freq: Every day | RECTAL | Status: DC | PRN

## 2011-05-16 MED ORDER — FLEET ENEMA 7-19 GM/118ML RE ENEM: 1.0000 | ENEMA | Freq: Every day | RECTAL | Status: DC | PRN

## 2011-05-16 MED ORDER — WITCH HAZEL-GLYCERIN EX PADS: 1.0000 "application " | MEDICATED_PAD | CUTANEOUS | Status: DC | PRN

## 2011-05-16 MED ORDER — SCOPOLAMINE 1 MG/3DAYS TD PT72
MEDICATED_PATCH | TRANSDERMAL | Status: AC
  Filled 2011-05-16: qty 1

## 2011-05-16 MED ORDER — KETOROLAC TROMETHAMINE 30 MG/ML IJ SOLN: 30.0000 mg | Freq: Four times a day (QID) | INTRAMUSCULAR | Status: AC | PRN

## 2011-05-16 MED ORDER — SODIUM CHLORIDE 0.9 % IV SOLN: 250.0000 mL | INTRAVENOUS | Status: DC

## 2011-05-16 MED ORDER — SODIUM CHLORIDE 0.9 % IJ SOLN: 3.0000 mL | Freq: Two times a day (BID) | INTRAMUSCULAR | Status: DC

## 2011-05-16 MED ORDER — NALBUPHINE HCL 10 MG/ML IJ SOLN
5.0000 mg | INTRAMUSCULAR | Status: DC | PRN
  Filled 2011-05-16: qty 1

## 2011-05-16 MED ORDER — MENTHOL 3 MG MT LOZG: 1.0000 | LOZENGE | OROMUCOSAL | Status: DC | PRN

## 2011-05-16 MED ORDER — DIPHENHYDRAMINE HCL 50 MG/ML IJ SOLN: 12.5000 mg | INTRAMUSCULAR | Status: DC | PRN

## 2011-05-16 MED ORDER — FENTANYL CITRATE 0.05 MG/ML IJ SOLN: 25.0000 ug | INTRAMUSCULAR | Status: DC | PRN

## 2011-05-16 MED ORDER — NALOXONE HCL 0.4 MG/ML IJ SOLN: 0.4000 mg | INTRAMUSCULAR | Status: DC | PRN

## 2011-05-16 MED ORDER — LANOLIN HYDROUS EX OINT: 1.0000 "application " | TOPICAL_OINTMENT | CUTANEOUS | Status: DC | PRN

## 2011-05-16 MED ORDER — ONDANSETRON HCL 4 MG PO TABS: 4.0000 mg | ORAL_TABLET | ORAL | Status: DC | PRN

## 2011-05-16 MED ORDER — HYDROCODONE-ACETAMINOPHEN 5-325 MG PO TABS
1.0000 | ORAL_TABLET | ORAL | Status: DC | PRN
  Administered 2011-05-16 – 2011-05-18 (×8): 2 via ORAL
  Filled 2011-05-16 (×8): qty 2

## 2011-05-16 MED ORDER — ZOLPIDEM TARTRATE 5 MG PO TABS: 5.0000 mg | ORAL_TABLET | Freq: Every evening | ORAL | Status: DC | PRN

## 2011-05-16 MED ORDER — DIBUCAINE 1 % RE OINT: 1.0000 "application " | TOPICAL_OINTMENT | RECTAL | Status: DC | PRN

## 2011-05-16 MED ORDER — OXYTOCIN 20 UNITS IN LACTATED RINGERS INFUSION - SIMPLE: 125.0000 mL/h | INTRAVENOUS | Status: AC

## 2011-05-16 MED ORDER — IBUPROFEN 600 MG PO TABS
600.0000 mg | ORAL_TABLET | Freq: Four times a day (QID) | ORAL | Status: DC | PRN
  Administered 2011-05-16 – 2011-05-17 (×2): 600 mg via ORAL
  Filled 2011-05-16 (×2): qty 1

## 2011-05-16 MED ORDER — SODIUM CHLORIDE 0.9 % IV SOLN
1.0000 ug/kg/h | INTRAVENOUS | Status: DC | PRN
  Filled 2011-05-16: qty 2.5

## 2011-05-16 MED ORDER — TETANUS-DIPHTH-ACELL PERTUSSIS 5-2.5-18.5 LF-MCG/0.5 IM SUSP: 0.5000 mL | Freq: Once | INTRAMUSCULAR | Status: DC

## 2011-05-16 MED ORDER — SIMETHICONE 80 MG PO CHEW
80.0000 mg | CHEWABLE_TABLET | ORAL | Status: DC | PRN
  Administered 2011-05-16: 80 mg via ORAL

## 2011-05-16 MED ORDER — DIPHENHYDRAMINE HCL 25 MG PO CAPS: 25.0000 mg | ORAL_CAPSULE | Freq: Four times a day (QID) | ORAL | Status: DC | PRN

## 2011-05-16 MED ORDER — SIMETHICONE 80 MG PO CHEW
80.0000 mg | CHEWABLE_TABLET | Freq: Three times a day (TID) | ORAL | Status: DC
  Administered 2011-05-16 – 2011-05-18 (×4): 80 mg via ORAL

## 2011-05-16 MED ORDER — DIPHENHYDRAMINE HCL 25 MG PO CAPS: 25.0000 mg | ORAL_CAPSULE | ORAL | Status: DC | PRN

## 2011-05-16 MED ORDER — PRENATAL MULTIVITAMIN CH
1.0000 | ORAL_TABLET | Freq: Every day | ORAL | Status: DC
  Administered 2011-05-16 – 2011-05-18 (×3): 1 via ORAL
  Filled 2011-05-16 (×3): qty 1

## 2011-05-16 MED ORDER — ONDANSETRON HCL 4 MG/2ML IJ SOLN: 4.0000 mg | INTRAMUSCULAR | Status: DC | PRN

## 2011-05-16 MED ORDER — MEPERIDINE HCL 25 MG/ML IJ SOLN: 6.2500 mg | INTRAMUSCULAR | Status: DC | PRN

## 2011-05-16 MED ORDER — METOCLOPRAMIDE HCL 5 MG/ML IJ SOLN: 10.0000 mg | Freq: Three times a day (TID) | INTRAMUSCULAR | Status: DC | PRN

## 2011-05-16 MED ORDER — LACTATED RINGERS IV SOLN
INTRAVENOUS | Status: DC
  Administered 2011-05-16: 05:00:00 via INTRAVENOUS

## 2011-05-16 MED ORDER — DIPHENHYDRAMINE HCL 50 MG/ML IJ SOLN: 25.0000 mg | INTRAMUSCULAR | Status: DC | PRN

## 2011-05-16 MED ORDER — SCOPOLAMINE 1 MG/3DAYS TD PT72
1.0000 | MEDICATED_PATCH | Freq: Once | TRANSDERMAL | Status: DC
  Administered 2011-05-16: 1.5 mg via TRANSDERMAL

## 2011-05-16 MED ORDER — KETOROLAC TROMETHAMINE 30 MG/ML IJ SOLN
30.0000 mg | Freq: Four times a day (QID) | INTRAMUSCULAR | Status: AC | PRN
  Administered 2011-05-16 (×2): 30 mg via INTRAVENOUS
  Filled 2011-05-16: qty 1

## 2011-05-16 MED ORDER — MEASLES, MUMPS & RUBELLA VAC ~~LOC~~ INJ
0.5000 mL | INJECTION | Freq: Once | SUBCUTANEOUS | Status: DC
  Filled 2011-05-16: qty 0.5

## 2011-05-16 MED ORDER — OXYCODONE-ACETAMINOPHEN 5-325 MG PO TABS
1.0000 | ORAL_TABLET | Freq: Four times a day (QID) | ORAL | Status: DC | PRN
  Filled 2011-05-16: qty 1

## 2011-05-16 MED ORDER — IBUPROFEN 800 MG PO TABS
800.0000 mg | ORAL_TABLET | Freq: Three times a day (TID) | ORAL | Status: DC | PRN
  Administered 2011-05-16 – 2011-05-18 (×4): 800 mg via ORAL
  Filled 2011-05-16 (×4): qty 1

## 2011-05-16 MED ORDER — SENNOSIDES-DOCUSATE SODIUM 8.6-50 MG PO TABS
2.0000 | ORAL_TABLET | Freq: Every day | ORAL | Status: DC
  Administered 2011-05-16 – 2011-05-17 (×2): 2 via ORAL

## 2011-05-16 MED ORDER — KETOROLAC TROMETHAMINE 30 MG/ML IJ SOLN
INTRAMUSCULAR | Status: AC
  Filled 2011-05-16: qty 1

## 2011-05-16 NOTE — Addendum Note (Signed)
Addendum  created 05/16/11 7829 by Suella Grove, CRNA   Modules edited:Notes Section

## 2011-05-16 NOTE — Progress Notes (Signed)
Subjective: Postpartum Day 1: Cesarean Delivery Patient reports tolerating PO.    Objective: Vital signs in last 24 hours: Temp:  [97 F (36.1 C)-101 F (38.3 C)] 98.1 F (36.7 C) (04/11 0600) Pulse Rate:  [64-100] 82  (04/11 0634) Resp:  [16-26] 16  (04/11 0600) BP: (98-137)/(55-90) 106/70 mmHg (04/11 0634) SpO2:  [95 %-100 %] 98 % (04/11 0600)  Physical Exam:  General: alert and cooperative Lochia: appropriate Uterine Fundus: firm Incision: abd dressing with small old drainage noted on bandage DVT Evaluation: No evidence of DVT seen on physical exam.   Basename 05/16/11 0550 05/14/11 2000  HGB 11.0* 14.3  HCT 32.2* 40.6    Assessment/Plan: Status post Cesarean section. Doing well postoperatively.  Continue current care.  Tiffany Paul G 05/16/2011, 7:56 AM

## 2011-05-16 NOTE — Anesthesia Postprocedure Evaluation (Signed)
  Anesthesia Post-op Note  Patient: Tiffany Paul  Procedure(s) Performed: Procedure(s) (LRB): CESAREAN SECTION (N/A)  Patient Location: PACU  Anesthesia Type: Epidural  Level of Consciousness: awake, alert  and oriented  Airway and Oxygen Therapy: Patient Spontanous Breathing  Post-op Pain: none  Post-op Assessment: Post-op Vital signs reviewed, Patient's Cardiovascular Status Stable, Respiratory Function Stable, Patent Airway, No signs of Nausea or vomiting, Pain level controlled, No headache and No backache  Post-op Vital Signs: Reviewed and stable  Complications: No apparent anesthesia complications

## 2011-05-16 NOTE — Anesthesia Postprocedure Evaluation (Signed)
  Anesthesia Post-op Note  Patient: Tiffany Paul  Procedure(s) Performed: Procedure(s) (LRB): CESAREAN SECTION (N/A)  Patient Location: Mother/Baby  Anesthesia Type: Epidural  Level of Consciousness: awake  Airway and Oxygen Therapy: Patient Spontanous Breathing  Post-op Pain: none  Post-op Assessment: Patient's Cardiovascular Status Stable, Respiratory Function Stable, Patent Airway, No signs of Nausea or vomiting, Adequate PO intake, Pain level controlled, No headache, No backache, No residual numbness and No residual motor weakness  Post-op Vital Signs: Reviewed and stable  Complications: No apparent anesthesia complications

## 2011-05-17 NOTE — Progress Notes (Signed)
Subjective: Postpartum Day 2: Cesarean Delivery Patient reports tolerating PO, + flatus and no problems voiding.    Objective: Vital signs in last 24 hours: Temp:  [97.6 F (36.4 C)-98.5 F (36.9 C)] 98 F (36.7 C) (04/12 0556) Pulse Rate:  [72-86] 75  (04/12 0556) Resp:  [16-20] 18  (04/12 0556) BP: (99-124)/(59-76) 103/68 mmHg (04/12 0556) SpO2:  [98 %] 98 % (04/11 1400)  Physical Exam:  General: alert and cooperative Lochia: appropriate Uterine Fundus: firm Incision: abd dressing noted with old drainage noted on bandage DVT Evaluation: No evidence of DVT seen on physical exam.   Basename 05/16/11 0550 05/14/11 2000  HGB 11.0* 14.3  HCT 32.2* 40.6    Assessment/Plan: Status post Cesarean section. Doing well postoperatively.  Dc IV atb's and fluids.  CURTIS,CAROL G 05/17/2011, 8:49 AM

## 2011-05-18 MED ORDER — HYDROCODONE-ACETAMINOPHEN 5-325 MG PO TABS: 1.0000 | ORAL_TABLET | ORAL | Status: AC | PRN

## 2011-05-18 MED ORDER — IBUPROFEN 800 MG PO TABS: 800.0000 mg | ORAL_TABLET | Freq: Three times a day (TID) | ORAL | Status: AC | PRN

## 2011-05-18 NOTE — Progress Notes (Signed)
Talked with lactation consultant to see pt

## 2011-05-18 NOTE — Progress Notes (Signed)
Subjective: Postpartum Day 3: Cesarean Delivery Patient reports tolerating PO, + flatus, + BM and no problems voiding.    Objective: Vital signs in last 24 hours: Temp:  [97.4 F (36.3 C)-98.3 F (36.8 C)] 97.6 F (36.4 C) (04/13 0800) Pulse Rate:  [76-81] 76  (04/13 0800) Resp:  [12-18] 16  (04/13 0800) BP: (109-126)/(68-81) 109/75 mmHg (04/13 0800) SpO2:  [98 %] 98 % (04/12 1424)  Physical Exam:  General: alert, cooperative and no distress Lochia: appropriate Uterine Fundus: firm Incision: healing well DVT Evaluation: No evidence of DVT seen on physical exam.   Basename 05/16/11 0550  HGB 11.0*  HCT 32.2*    Assessment/Plan: Status post Cesarean section. Doing well postoperatively.  Discharge home with standard precautions and return to clinic in 4-6 weeks.  Matyas Baisley C 05/18/2011, 9:45 AM

## 2011-05-18 NOTE — Progress Notes (Signed)
Pt attempting to breastfeed infant.  Infant crying.  Pt pumped breast and RN spoon fed infant approx 3 cc of breastmilk

## 2011-05-20 ENCOUNTER — Encounter (HOSPITAL_COMMUNITY): Payer: Self-pay | Admitting: Obstetrics and Gynecology

## 2011-05-20 NOTE — Discharge Summary (Signed)
Obstetric Discharge Summary Reason for Admission: rupture of membranes Prenatal Procedures: ultrasound Intrapartum Procedures: cesarean: low cervical, transverse Postpartum Procedures: none Complications-Operative and Postpartum: none Hemoglobin  Date Value Range Status  05/16/2011 11.0* 12.0-15.0 (g/dL) Final     DELTA CHECK NOTED     REPEATED TO VERIFY     HCT  Date Value Range Status  05/16/2011 32.2* 36.0-46.0 (%) Final    Physical Exam:  General: alert and cooperative Lochia: appropriate Uterine Fundus: firm Incision: healing well DVT Evaluation: No evidence of DVT seen on physical exam.  Discharge Diagnoses: Term Pregnancy-delivered  Discharge Information: Date: 05/20/2011 Activity: pelvic rest Diet: routine Medications: PNV, Ibuprofen and Percocet Condition: stable Instructions: refer to practice specific booklet Discharge to: home   Newborn Data: Live born female  Birth Weight: 8 lb (3630 g) APGAR: 9, 9  Home with mother.  Tiffany Paul G 05/20/2011, 8:56 AM

## 2011-05-27 ENCOUNTER — Ambulatory Visit (HOSPITAL_COMMUNITY)
Admission: RE | Admit: 2011-05-27 | Discharge: 2011-05-27 | Disposition: A | Discharge status: Home / Self Care | Payer: BC Managed Care – PPO | Source: Ambulatory Visit | Attending: Obstetrics and Gynecology | Admitting: Obstetrics and Gynecology

## 2011-05-27 NOTE — Progress Notes (Signed)
Adult Lactation Consultation Outpatient Visit Note  Patient Name: Tiffany Paul Date of Birth: November 04, 1975 Gestational Age at Delivery: [redacted]w[redacted]d Type of Delivery:   Breastfeeding History: Frequency of Breastfeeding: every 1 1/2-3 hrs  Length of Feeding: 20 -40 mins Voids: 5 Stools: 3 yellow seedy  Supplementing / Method: Pumping:  Type of Pump:Medela   Frequency:4-5 times daily  Volume:  30 ml  Comments: Mother has had very painful latches since birth. Smart start fit mother with #20 nipple shield which has help. Nipples are still pink with several small blisters and cracks.  Infant is not up to birth weight. Mther supplemented 20-30 ml 3 times on Saturday, but states she wanted to see if infant still needed additional supplement so she scheduled out patient visit.   Consultation Evaluation: Mothers breast are very firm and full. She states she offer breast for 15-20 mins at 8:30. Now 2-2 1/2 hours later. Jean Rosenthal is still sleeping. He had very wet diaper before pre weight. Mother was fit with #24 nipple shield for proper size. Observed feeding for 18 mins and transferred 22ml, from (R) breast. Infant latched deeper per mother on this nipple shield. Latch needed only slight adjustment for wider gape. Mother instructed in use of breast compression to stimulate good suckling pattern. Infant latched to (L) breast for 22 mins and transferred 23 ml. Mother post pumped and obtained 60ml. Infant was given 30 ml with bottle using slow flow nipple.  Mothers pump checked for proper pressure. Pump ok.  Initial Feeding Assessment: Pre-feed Weight:3332  Post-feed [id number]:0454 Amount Transferred:22ML Comments:  Additional Feeding Assessment: Pre-feed [id number]:1478 Post-feed [id number]:3086 Amount Transferred:23ML Comments:  Additional Feeding Assessment: Pre-feed Weight: Post-feed Weight: Amount Transferred: Comments:  Total Breast milk Transferred this Visit: 45 ML Total Supplement Given: 30  ML EBM Total feeding:75 ml  Additional Interventions: 1) mother to pump 6 times daily if possible for 20 mins with breast massage before pumping. 2) Use #24 nipple shield, #27 flange 3) Use good breast compression during feeding. 4) Supplement with EBM giving at least 30 ml after each feeding. 5) Mother to call smart start to follow Thursday or Friday for pre and post weights.  Follow-Up  April 29    Stevan Born [facility] 05/27/2011, 11:00 AM

## 2011-06-03 ENCOUNTER — Ambulatory Visit (HOSPITAL_COMMUNITY)
Admission: RE | Admit: 2011-06-03 | Discharge: 2011-06-03 | Disposition: A | Discharge status: Home / Self Care | Payer: BC Managed Care – PPO | Source: Ambulatory Visit | Attending: Obstetrics and Gynecology | Admitting: Obstetrics and Gynecology

## 2011-06-03 NOTE — Consult Note (Signed)
Tiffany Paul comes in today to follow up following an OP appointment from 05/27/11 ( 1 week ago ).  Baby has gained 9 oz in 7 days.    She is using a nipple shield to help with latch and pain associated with baby not latching properly.  Thurza had been giving baby 30 ml after breastfeeding, but notes that baby doesn't seem to want to take the supplement the last couple days.  Watched her latch the baby in cross cradle hold.  Baby fed on both breasts well.  Total breast milk intake from both sides was 2.6 oz.  Mom will discontinue the supplementing pc, and solely breast feed (more often most likely) and pump pc 2-3 times a day to support her milk supply.  A brief try to latch baby without nipple shield was unsuccessful.  Reassured Ladell that baby was doing great.  Mom to feed baby on cue, at least every 2-3 hours.  DC supplement pc. Pump 2-3 times a day to support milk supply  Weight check 06/07/11

## 2011-06-07 ENCOUNTER — Ambulatory Visit (HOSPITAL_COMMUNITY)
Admission: RE | Admit: 2011-06-07 | Discharge: 2011-06-07 | Disposition: A | Discharge status: Home / Self Care | Payer: BC Managed Care – PPO | Source: Ambulatory Visit | Attending: Obstetrics and Gynecology | Admitting: Obstetrics and Gynecology

## 2011-06-07 NOTE — Progress Notes (Signed)
Adult Lactation Consultation Outpatient Visit Note  Patient Name: Tiffany Paul    Baby: Tiffany Paul Date of Birth: May 08, 1975               DOB: [birth year] Gestational Age at Delivery: 68.6 BIRTH WEIGHT: 8-0 Type of Delivery: C/S                    WEIGHT TODAY: 8-5.2  Breastfeeding History: Frequency of Breastfeeding: EVERY 1-3 HOURS Length of Feeding:  Voids: QS Stools: QS  Supplementing / Method:NONE Pumping:  Type of Pump:DEBP   Frequency:3 TIMES/24 HOURS  Volume:  5-6 OZ AFTER FEEDING  Comments:    Consultation Evaluation:Mom and baby here for weight check.  Mom stopped supplementation after appointment here 4 days ago.  Baby has gained 7.7 oz/4 days.  Mom very reassured.  Recommended she try to wean off pumping unless needed for comfort.  Encouraged to call office for concerns/appointment prn.  Initial Feeding Assessment: Pre-feed Weight: Post-feed Weight: Amount Transferred: Comments:  Additional Feeding Assessment: Pre-feed Weight: Post-feed Weight: Amount Transferred: Comments:  Additional Feeding Assessment: Pre-feed Weight: Post-feed Weight: Amount Transferred: Comments:  Total Breast milk Transferred this Visit:  Total Supplement Given:   Additional Interventions:   Follow-Upwill call prn      Hansel Feinstein 06/07/2011, 2:39 PM

## 2011-08-05 ENCOUNTER — Other Ambulatory Visit: Payer: Self-pay | Admitting: Dermatology

## 2013-12-06 ENCOUNTER — Encounter (HOSPITAL_COMMUNITY): Payer: Self-pay | Admitting: Obstetrics and Gynecology

## 2014-09-19 DIAGNOSIS — Z6827 Body mass index (BMI) 27.0-27.9, adult: Secondary | ICD-10-CM | POA: Insufficient documentation

## 2017-02-01 ENCOUNTER — Other Ambulatory Visit: Payer: Self-pay | Admitting: Obstetrics and Gynecology

## 2017-02-01 DIAGNOSIS — Z803 Family history of malignant neoplasm of breast: Secondary | ICD-10-CM

## 2017-02-09 ENCOUNTER — Ambulatory Visit
Admission: RE | Admit: 2017-02-09 | Discharge: 2017-02-09 | Disposition: A | Discharge status: Home / Self Care | Payer: BC Managed Care – PPO | Source: Ambulatory Visit | Attending: Obstetrics and Gynecology | Admitting: Obstetrics and Gynecology

## 2017-02-09 DIAGNOSIS — Z803 Family history of malignant neoplasm of breast: Secondary | ICD-10-CM

## 2017-02-09 MED ORDER — GADOBENATE DIMEGLUMINE 529 MG/ML IV SOLN
15.0000 mL | Freq: Once | INTRAVENOUS | Status: AC | PRN
  Administered 2017-02-09: 15 mL via INTRAVENOUS

## 2018-07-10 DIAGNOSIS — M75101 Unspecified rotator cuff tear or rupture of right shoulder, not specified as traumatic: Secondary | ICD-10-CM | POA: Insufficient documentation

## 2019-10-04 MED FILL — BLISOVI FE 1/20 1-20 MG-MCG: 1-20 | 84 days supply | Qty: 84 | Fill #0

## 2019-10-14 MED FILL — BLISOVI FE 1/20 1-20 MG-MCG: 1-20 | 84 days supply | Qty: 84 | Fill #0

## 2019-12-27 ENCOUNTER — Other Ambulatory Visit (HOSPITAL_COMMUNITY): Payer: Self-pay | Admitting: Obstetrics and Gynecology

## 2019-12-27 DIAGNOSIS — Z309 Encounter for contraceptive management, unspecified: Secondary | ICD-10-CM | POA: Diagnosis not present

## 2019-12-27 DIAGNOSIS — Z01419 Encounter for gynecological examination (general) (routine) without abnormal findings: Secondary | ICD-10-CM | POA: Diagnosis not present

## 2019-12-27 DIAGNOSIS — Z6832 Body mass index (BMI) 32.0-32.9, adult: Secondary | ICD-10-CM | POA: Diagnosis not present

## 2019-12-27 MED FILL — PHENTERMINE 30 MG CAPSULE: 30 | 30 days supply | Qty: 30 | Fill #0

## 2019-12-27 MED FILL — BLISOVI FE 1/20 1-20 MG-MCG: 1-20 | 84 days supply | Qty: 84 | Fill #0

## 2020-01-03 DIAGNOSIS — Z1329 Encounter for screening for other suspected endocrine disorder: Secondary | ICD-10-CM | POA: Diagnosis not present

## 2020-01-03 DIAGNOSIS — Z13228 Encounter for screening for other metabolic disorders: Secondary | ICD-10-CM | POA: Diagnosis not present

## 2020-01-03 DIAGNOSIS — Z13220 Encounter for screening for lipoid disorders: Secondary | ICD-10-CM | POA: Diagnosis not present

## 2020-01-24 MED FILL — PHENTERMINE 30 MG CAPSULE: 30 | 30 days supply | Qty: 30 | Fill #1

## 2020-02-21 MED FILL — PHENTERMINE 30 MG CAPSULE: 30 | 30 days supply | Qty: 30 | Fill #2

## 2020-04-04 ENCOUNTER — Other Ambulatory Visit: Payer: Self-pay

## 2020-04-04 ENCOUNTER — Emergency Department
Admission: RE | Admit: 2020-04-04 | Discharge: 2020-04-04 | Disposition: A | Discharge status: Home / Self Care | Payer: PRIVATE HEALTH INSURANCE | Source: Ambulatory Visit

## 2020-04-04 VITALS — BP 170/94 | HR 75 | Temp 98.3°F | Resp 17

## 2020-04-04 DIAGNOSIS — J019 Acute sinusitis, unspecified: Secondary | ICD-10-CM

## 2020-04-04 DIAGNOSIS — R059 Cough, unspecified: Secondary | ICD-10-CM

## 2020-04-04 MED ORDER — AMOXICILLIN 875 MG PO TABS: 875.0000 mg | ORAL_TABLET | Freq: Two times a day (BID) | ORAL | 0 refills | Status: AC

## 2020-04-04 MED ORDER — FLUCONAZOLE 150 MG PO TABS: 150.0000 mg | ORAL_TABLET | Freq: Once | ORAL | 0 refills | Status: AC

## 2020-04-04 MED ORDER — PROMETHAZINE-DM 6.25-15 MG/5ML PO SYRP: 5.0000 mL | ORAL_SOLUTION | Freq: Four times a day (QID) | ORAL | 0 refills | Status: DC | PRN

## 2020-04-04 NOTE — ED Triage Notes (Signed)
Pt c/o  scratchy throat, cough and runny nose. X 5 days. Now having facial pain/pressure. Denies fever. Tested via PCR by work which was negative. Benedryl last night. Ibuprofen prn.

## 2020-04-04 NOTE — ED Provider Notes (Signed)
[name]    CSN: [id number] Arrival date & time: 04/04/20  1149      History   Chief Complaint Chief Complaint  Patient presents with  . Nasal Congestion  . Cough    HPI Tiffany Paul is a 45 y.o. female.   HPI Patient presents today with 5 days of nasal congestion which is progressively worsened, throat irritation, facial pressure, without fever. Patient was tested for Covid with a PCR test which was negative on yesterday. She has been managing symptoms at home with Benadryl and ibuprofen without relief. She endorses a cough which is nonproductive. Last night she felt as if she was wheezing due to the persistency of cough. No history of asthma or chronic respiratory disease. She is currently afebrile. Past Medical History:  Diagnosis Date  . Endometriosis   . Gestational diabetes     There are no problems to display for this patient.   Past Surgical History:  Procedure Laterality Date  . CESAREAN SECTION  05/15/2011   Procedure: CESAREAN SECTION;  Surgeon: Margarette Asal, MD;  Location: [facility];  Service: Gynecology;  Laterality: N/A;  Primary Cesarean Section Delivery Boy @ 2254, Apgars 9/9  . CHOLECYSTECTOMY    . EXPLORATORY LAPAROTOMY     For endometrosis  . WISDOM TOOTH EXTRACTION      OB History    Gravida  1   Para  1   Term  1   Preterm  0   AB  0   Living  1     SAB  0   IAB  0   Ectopic  0   Multiple  0   Live Births  1            Home Medications    Prior to Admission medications   Medication Sig Start Date End Date Taking? Authorizing Provider  norethindrone-ethinyl estradiol (MICROGESTIN FE 1/20) 1-20 MG-MCG tablet  04/30/19  Yes [provider]  Calcium Carbonate Antacid (TUMS PO) Take 1-2 tablets by mouth daily as needed. For heartburn    [provider]  famotidine (PEPCID) 20 MG tablet Take 20 mg by mouth 2 (two) times daily as needed. For heartburn    [provider]  Prenatal  Vit-Fe Fumarate-FA (PRENATAL MULTIVITAMIN) TABS Take 1 tablet by mouth daily.    [provider]    Family History Family History  Problem Relation Age of Onset  . Cancer Other   . Hypertension Other   . Cancer Mother   . Diabetes Father   . Heart disease Paternal Uncle   . Hypertension Paternal Uncle     Social History Social History   Tobacco Use  . Smoking status: Never Smoker  . Smokeless tobacco: Never Used  Substance Use Topics  . Alcohol use: No  . Drug use: No     Allergies   Cephalexin   Review of Systems Review of Systems Pertinent negatives listed in HPI  Physical Exam Triage Vital Signs ED Triage Vitals  Enc Vitals Group     BP 04/04/20 1204 (!) 170/94     Pulse Rate 04/04/20 1204 75     Resp 04/04/20 1204 17     Temp 04/04/20 1204 98.3 F (36.8 C)     Temp Source 04/04/20 1204 Oral     SpO2 04/04/20 1204 97 %     Weight --      Height --      Head Circumference --  Peak Flow --      Pain Score 04/04/20 1206 0     Pain Loc --      Pain Edu? --      Excl. in [place]? --    No data found.  Updated Vital Signs BP (!) 170/94 (BP Location: Left Arm)   Pulse 75   Temp 98.3 F (36.8 C) (Oral)   Resp 17   SpO2 97%   Visual Acuity Right Eye Distance:   Left Eye Distance:   Bilateral Distance:    Right Eye Near:   Left Eye Near:    Bilateral Near:     Physical Exam  General Appearance:    Alert, cooperative, no distress  HENT:   Normocephalic, ears normal, nares mucosal edema with congestion, rhinorrhea, oropharynx w/o erythema or exudate  Eyes:    PERRL, conjunctiva/corneas clear, EOM's intact       Lungs:     Clear to auscultation bilaterally, respirations unlabored  Heart:    Regular rate and rhythm  Neurologic:   Awake, alert, oriented x 3. No apparent focal neurological           defect.      UC Treatments / Results  Labs (all labs ordered are listed, but only abnormal results are displayed) Labs Reviewed - No data  to display  EKG   Radiology No results found.  Procedures Procedures (including critical care time)  Medications Ordered in UC Medications - No data to display  Initial Impression / Assessment and Plan / UC Course  I have reviewed the triage vital signs and the nursing notes.  Pertinent labs & imaging results that were available during my care of the patient were reviewed by me and considered in my medical decision making (see chart for details).      Acute sinusitis empiric treatment initiated with amoxicillin twice daily x10 days, Promethazine DM for cough management, and fluconazole for prophylaxis against use with antibiotic treatment.  Return precautions discussed.   Final Clinical Impressions(s) / UC Diagnoses   Final diagnoses:  Acute non-recurrent sinusitis, unspecified location   Discharge Instructions   None    ED Prescriptions    Medication Sig Dispense Auth. Provider   amoxicillin (AMOXIL) 875 MG tablet Take 1 tablet (875 mg total) by mouth 2 (two) times daily for 10 days. 20 tablet Scot Jun, FNP   promethazine-dextromethorphan (PROMETHAZINE-DM) 6.25-15 MG/5ML syrup Take 5 mLs by mouth 4 (four) times daily as needed for cough. 140 mL Scot Jun, FNP   fluconazole (DIFLUCAN) 150 MG tablet Take 1 tablet (150 mg total) by mouth once for 1 dose. Repeat if needed 1 tablet Scot Jun, FNP     PDMP not reviewed this encounter.   Scot Jun, [place] 04/08/20 [phone]

## 2020-04-05 DIAGNOSIS — H5202 Hypermetropia, left eye: Secondary | ICD-10-CM | POA: Diagnosis not present

## 2020-04-05 MED FILL — BLISOVI FE 1/20 1-20 MG-MCG: 1-20 | 84 days supply | Qty: 84 | Fill #1

## 2020-05-09 DIAGNOSIS — Z1231 Encounter for screening mammogram for malignant neoplasm of breast: Secondary | ICD-10-CM | POA: Diagnosis not present

## 2020-05-29 ENCOUNTER — Other Ambulatory Visit: Payer: Self-pay | Admitting: Obstetrics and Gynecology

## 2020-05-29 DIAGNOSIS — Z803 Family history of malignant neoplasm of breast: Secondary | ICD-10-CM

## 2020-06-28 ENCOUNTER — Other Ambulatory Visit (HOSPITAL_COMMUNITY): Payer: Self-pay

## 2020-06-28 MED FILL — Norethindrone Ace & Ethinyl Estradiol-FE Tab 1 MG-20 MCG: ORAL | 84 days supply | Qty: 84 | Fill #0 | Status: AC

## 2020-06-29 ENCOUNTER — Other Ambulatory Visit (HOSPITAL_COMMUNITY): Payer: Self-pay

## 2020-09-12 MED FILL — Norethindrone Ace & Ethinyl Estradiol-FE Tab 1 MG-20 MCG: ORAL | 84 days supply | Qty: 84 | Fill #1 | Status: AC

## 2020-09-13 ENCOUNTER — Other Ambulatory Visit (HOSPITAL_COMMUNITY): Payer: Self-pay

## 2020-10-10 DIAGNOSIS — F4321 Adjustment disorder with depressed mood: Secondary | ICD-10-CM | POA: Diagnosis not present

## 2020-10-17 DIAGNOSIS — F4321 Adjustment disorder with depressed mood: Secondary | ICD-10-CM | POA: Diagnosis not present

## 2020-11-08 DIAGNOSIS — F4321 Adjustment disorder with depressed mood: Secondary | ICD-10-CM | POA: Diagnosis not present

## 2020-11-15 DIAGNOSIS — F4321 Adjustment disorder with depressed mood: Secondary | ICD-10-CM | POA: Diagnosis not present

## 2020-11-22 DIAGNOSIS — F4321 Adjustment disorder with depressed mood: Secondary | ICD-10-CM | POA: Diagnosis not present

## 2020-12-05 DIAGNOSIS — F4321 Adjustment disorder with depressed mood: Secondary | ICD-10-CM | POA: Diagnosis not present

## 2020-12-13 ENCOUNTER — Other Ambulatory Visit (HOSPITAL_COMMUNITY): Payer: Self-pay

## 2020-12-13 MED ORDER — NORETHIN ACE-ETH ESTRAD-FE 1-20 MG-MCG PO TABS
1.0000 | ORAL_TABLET | Freq: Every day | ORAL | 0 refills | Status: DC
  Filled 2020-12-13: qty 28, 28d supply, fill #0

## 2020-12-15 ENCOUNTER — Other Ambulatory Visit (HOSPITAL_COMMUNITY): Payer: Self-pay

## 2020-12-27 ENCOUNTER — Ambulatory Visit
Admission: RE | Admit: 2020-12-27 | Discharge: 2020-12-27 | Disposition: A | Discharge status: Home / Self Care | Payer: 59 | Source: Ambulatory Visit | Attending: Obstetrics and Gynecology | Admitting: Obstetrics and Gynecology

## 2020-12-27 ENCOUNTER — Other Ambulatory Visit: Payer: Self-pay

## 2020-12-27 ENCOUNTER — Other Ambulatory Visit (HOSPITAL_COMMUNITY): Payer: Self-pay

## 2020-12-27 DIAGNOSIS — Z803 Family history of malignant neoplasm of breast: Secondary | ICD-10-CM

## 2020-12-27 DIAGNOSIS — Z01419 Encounter for gynecological examination (general) (routine) without abnormal findings: Secondary | ICD-10-CM | POA: Diagnosis not present

## 2020-12-27 DIAGNOSIS — Z6830 Body mass index (BMI) 30.0-30.9, adult: Secondary | ICD-10-CM | POA: Diagnosis not present

## 2020-12-27 DIAGNOSIS — Z309 Encounter for contraceptive management, unspecified: Secondary | ICD-10-CM | POA: Diagnosis not present

## 2020-12-27 DIAGNOSIS — Z1239 Encounter for other screening for malignant neoplasm of breast: Secondary | ICD-10-CM | POA: Diagnosis not present

## 2020-12-27 MED ORDER — NORETHIN ACE-ETH ESTRAD-FE 1-20 MG-MCG PO TABS
1.0000 | ORAL_TABLET | Freq: Every day | ORAL | 3 refills | Status: DC
  Filled 2020-12-27 – 2021-01-01 (×2): qty 84, 84d supply, fill #0
  Filled 2021-03-27: qty 84, 84d supply, fill #1
  Filled 2021-06-23: qty 84, 84d supply, fill #2
  Filled 2021-09-15: qty 84, 84d supply, fill #3

## 2020-12-27 MED ORDER — GADOBUTROL 1 MMOL/ML IV SOLN
9.0000 mL | Freq: Once | INTRAVENOUS | Status: AC | PRN
  Administered 2020-12-27: 9 mL via INTRAVENOUS

## 2021-01-01 ENCOUNTER — Other Ambulatory Visit (HOSPITAL_COMMUNITY): Payer: Self-pay

## 2021-01-02 DIAGNOSIS — Z13228 Encounter for screening for other metabolic disorders: Secondary | ICD-10-CM | POA: Diagnosis not present

## 2021-01-02 DIAGNOSIS — Z13220 Encounter for screening for lipoid disorders: Secondary | ICD-10-CM | POA: Diagnosis not present

## 2021-01-04 ENCOUNTER — Other Ambulatory Visit (HOSPITAL_COMMUNITY): Payer: Self-pay

## 2021-01-24 DIAGNOSIS — F4321 Adjustment disorder with depressed mood: Secondary | ICD-10-CM | POA: Diagnosis not present

## 2021-02-05 DIAGNOSIS — F4321 Adjustment disorder with depressed mood: Secondary | ICD-10-CM | POA: Diagnosis not present

## 2021-03-13 DIAGNOSIS — Z1211 Encounter for screening for malignant neoplasm of colon: Secondary | ICD-10-CM | POA: Diagnosis not present

## 2021-03-28 ENCOUNTER — Other Ambulatory Visit (HOSPITAL_COMMUNITY): Payer: Self-pay

## 2021-04-24 ENCOUNTER — Other Ambulatory Visit (HOSPITAL_COMMUNITY): Payer: Self-pay

## 2021-04-24 DIAGNOSIS — K219 Gastro-esophageal reflux disease without esophagitis: Secondary | ICD-10-CM | POA: Diagnosis not present

## 2021-04-24 DIAGNOSIS — Z9049 Acquired absence of other specified parts of digestive tract: Secondary | ICD-10-CM | POA: Diagnosis not present

## 2021-04-24 DIAGNOSIS — R195 Other fecal abnormalities: Secondary | ICD-10-CM | POA: Diagnosis not present

## 2021-04-24 MED ORDER — PEG 3350-KCL-NA BICARB-NACL 420 G PO SOLR
ORAL | 0 refills | Status: DC
  Filled 2021-04-24: qty 4000, 1d supply, fill #0

## 2021-05-03 DIAGNOSIS — R195 Other fecal abnormalities: Secondary | ICD-10-CM | POA: Diagnosis not present

## 2021-05-03 DIAGNOSIS — D123 Benign neoplasm of transverse colon: Secondary | ICD-10-CM | POA: Diagnosis not present

## 2021-05-03 DIAGNOSIS — D128 Benign neoplasm of rectum: Secondary | ICD-10-CM | POA: Diagnosis not present

## 2021-05-17 DIAGNOSIS — Z1231 Encounter for screening mammogram for malignant neoplasm of breast: Secondary | ICD-10-CM | POA: Diagnosis not present

## 2021-05-24 ENCOUNTER — Other Ambulatory Visit: Payer: Self-pay | Admitting: Obstetrics and Gynecology

## 2021-05-24 ENCOUNTER — Other Ambulatory Visit (HOSPITAL_COMMUNITY): Payer: Self-pay | Admitting: Obstetrics and Gynecology

## 2021-05-24 DIAGNOSIS — Z803 Family history of malignant neoplasm of breast: Secondary | ICD-10-CM

## 2021-06-25 ENCOUNTER — Other Ambulatory Visit (HOSPITAL_COMMUNITY): Payer: Self-pay

## 2021-06-29 ENCOUNTER — Other Ambulatory Visit (HOSPITAL_COMMUNITY): Payer: Self-pay

## 2021-07-03 DIAGNOSIS — K626 Ulcer of anus and rectum: Secondary | ICD-10-CM | POA: Diagnosis not present

## 2021-07-03 DIAGNOSIS — Z86010 Personal history of colonic polyps: Secondary | ICD-10-CM | POA: Diagnosis not present

## 2021-07-18 ENCOUNTER — Other Ambulatory Visit: Payer: Self-pay | Admitting: Obstetrics and Gynecology

## 2021-07-18 DIAGNOSIS — R739 Hyperglycemia, unspecified: Secondary | ICD-10-CM | POA: Diagnosis not present

## 2021-07-18 DIAGNOSIS — Z803 Family history of malignant neoplasm of breast: Secondary | ICD-10-CM

## 2021-09-17 ENCOUNTER — Other Ambulatory Visit (HOSPITAL_COMMUNITY): Payer: Self-pay

## 2021-11-26 ENCOUNTER — Ambulatory Visit
Admission: RE | Admit: 2021-11-26 | Discharge: 2021-11-26 | Disposition: A | Discharge status: Home / Self Care | Payer: 59 | Source: Ambulatory Visit | Attending: Obstetrics and Gynecology | Admitting: Obstetrics and Gynecology

## 2021-11-26 DIAGNOSIS — R918 Other nonspecific abnormal finding of lung field: Secondary | ICD-10-CM | POA: Diagnosis not present

## 2021-11-26 DIAGNOSIS — Z803 Family history of malignant neoplasm of breast: Secondary | ICD-10-CM

## 2021-11-26 DIAGNOSIS — Z1239 Encounter for other screening for malignant neoplasm of breast: Secondary | ICD-10-CM | POA: Diagnosis not present

## 2021-11-26 MED ORDER — GADOPICLENOL 0.5 MMOL/ML IV SOLN
8.0000 mL | Freq: Once | INTRAVENOUS | Status: AC | PRN
  Administered 2021-11-26: 8 mL via INTRAVENOUS

## 2021-12-07 ENCOUNTER — Other Ambulatory Visit (HOSPITAL_COMMUNITY): Payer: Self-pay

## 2021-12-07 MED ORDER — NORETHIN ACE-ETH ESTRAD-FE 1-20 MG-MCG PO TABS
1.0000 | ORAL_TABLET | Freq: Every day | ORAL | 3 refills | Status: DC
  Filled 2021-12-07: qty 84, 84d supply, fill #0
  Filled 2022-03-04 – 2022-03-05 (×2): qty 84, 84d supply, fill #1
  Filled 2022-05-26: qty 84, 84d supply, fill #2
  Filled 2022-08-18: qty 84, 84d supply, fill #3

## 2022-01-17 ENCOUNTER — Other Ambulatory Visit (HOSPITAL_COMMUNITY): Payer: Self-pay

## 2022-01-17 DIAGNOSIS — Z01419 Encounter for gynecological examination (general) (routine) without abnormal findings: Secondary | ICD-10-CM | POA: Diagnosis not present

## 2022-01-17 DIAGNOSIS — Z6830 Body mass index (BMI) 30.0-30.9, adult: Secondary | ICD-10-CM | POA: Diagnosis not present

## 2022-01-17 DIAGNOSIS — Z124 Encounter for screening for malignant neoplasm of cervix: Secondary | ICD-10-CM | POA: Diagnosis not present

## 2022-01-17 DIAGNOSIS — Z1151 Encounter for screening for human papillomavirus (HPV): Secondary | ICD-10-CM | POA: Diagnosis not present

## 2022-01-17 DIAGNOSIS — Z760 Encounter for issue of repeat prescription: Secondary | ICD-10-CM | POA: Diagnosis not present

## 2022-01-17 MED ORDER — NORETHIN ACE-ETH ESTRAD-FE 1-20 MG-MCG PO TABS
1.0000 | ORAL_TABLET | Freq: Every day | ORAL | 3 refills | Status: DC
  Filled 2022-01-17 – 2022-11-04 (×2): qty 84, 84d supply, fill #0

## 2022-02-25 ENCOUNTER — Ambulatory Visit (INDEPENDENT_AMBULATORY_CARE_PROVIDER_SITE_OTHER): Payer: Commercial Managed Care - PPO

## 2022-02-25 ENCOUNTER — Ambulatory Visit
Admission: EM | Admit: 2022-02-25 | Discharge: 2022-02-25 | Disposition: A | Discharge status: Home / Self Care | Payer: Commercial Managed Care - PPO | Attending: Emergency Medicine | Admitting: Emergency Medicine

## 2022-02-25 ENCOUNTER — Ambulatory Visit: Admit: 2022-02-25 | Discharge status: Absent without leave | Payer: Commercial Managed Care - PPO

## 2022-02-25 DIAGNOSIS — K5900 Constipation, unspecified: Secondary | ICD-10-CM | POA: Diagnosis not present

## 2022-02-25 DIAGNOSIS — R1012 Left upper quadrant pain: Secondary | ICD-10-CM

## 2022-02-25 DIAGNOSIS — R1013 Epigastric pain: Secondary | ICD-10-CM

## 2022-02-25 MED ORDER — POLYETHYLENE GLYCOL 3350 17 GM/SCOOP PO POWD: 17.0000 g | Freq: Every morning | ORAL | 2 refills | Status: AC

## 2022-02-25 NOTE — Discharge Instructions (Signed)
I recommend that you purchase a bottle of magnesium citrate and use it as directed to clear your bowels.  I also recommend that you continue the bowel regimen of MiraLAX, 1 capful mixed into 8 ounces of your favorite beverage daily in the morning to keep your bowels soft and to help the muscle tone of your intestines to return to normal.  Please follow-up in the next 3 to 5 days if you are not feeling any better.  Thank you for visiting urgent care today.

## 2022-02-25 NOTE — ED Triage Notes (Signed)
Pt c/o epigastric/abd pain since yesterday afternoon. Some nausea. Denies vomiting. Hx of GERD. Has not taken any meds.

## 2022-02-25 NOTE — ED Provider Notes (Signed)
T[facility]    CSN: [id number] Arrival date & time: 02/25/22  1116    HISTORY   Chief Complaint  Patient presents with   Abdominal Pain    Epigastric pain   HPI Tiffany Paul is a pleasant, 47 y.o. female who presents to urgent care today. 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  Patient complains of upper quadrant pain and epigastric pain with palpation that began yesterday afternoon.  Patient states she has had similar symptoms in the past but never this long or this intense.  Patient states she has had a little bit of nausea without vomiting.  Patient states has been able to tolerate eating food which does not make her pain any better or worse.  Patient states she has a history of GERD but is not currently taking any current medication, denies reflux or pain worse at night.  Patient states she has attempted to apply pressure with a pillow to her left upper quadrant without relief.  Patient states she also feels bloated in her upper abdomen.  Patient states she has never been formally diagnosed with irritable bowel syndrome however has always had an "irritable relationship" with her bowels.  Patient states she normally has several bowel movements per day, states her stool does not appear to be any smaller or firmer than usual but admits she may be a little bit "behind" with moving her bowels at this time.  Patient reports history of cholecystectomy and endometriosis, both performed laparoscopically.  The history is provided by the patient.   Past Medical History:  Diagnosis Date   Endometriosis    Gestational diabetes    There are no problems to display for this patient.  Past Surgical History:  Procedure Laterality Date   CESAREAN SECTION  05/15/2011   Procedure: CESAREAN SECTION;  Surgeon: Margarette Asal, MD;  Location: [facility];  Service: Gynecology;  Laterality: N/A;  Primary Cesarean Section Delivery Boy @ 2254, Apgars 9/9   CHOLECYSTECTOMY      EXPLORATORY LAPAROTOMY     For endometrosis   WISDOM TOOTH EXTRACTION     OB History     Gravida  1   Para  1   Term  1   Preterm  0   AB  0   Living  1      SAB  0   IAB  0   Ectopic  0   Multiple  0   Live Births  1          Home Medications    Prior to Admission medications   Medication Sig Start Date End Date Taking? Authorizing Provider  Calcium Carbonate Antacid (TUMS PO) Take 1-2 tablets by mouth daily as needed. For heartburn    [provider]  famotidine (PEPCID) 20 MG tablet Take 20 mg by mouth 2 (two) times daily as needed. For heartburn    [provider]  norethindrone-ethinyl estradiol-FE (LOESTRIN FE) 1-20 MG-MCG tablet TAKE 1 TABLET BY MOUTH EVERY DAY 12/27/19 12/26/20  Molli Posey, MD  norethindrone-ethinyl estradiol ([place] FE 1/20) 1-20 MG-MCG tablet  04/30/19   [provider]  norethindrone-ethinyl estradiol-FE (BLISOVI FE 1/20) 1-20 MG-MCG tablet Take 1 tablet by mouth daily. 12/07/21     norethindrone-ethinyl estradiol-FE (BLISOVI FE 1/20) 1-20 MG-MCG tablet TAKE 1 TABLET BY MOUTH EVERY DAY 01/17/22     phentermine 30 MG capsule TAKE 1 CAPSULE EVERY DAY BY MOUTH IN THE MORNING 12/27/19 06/24/20  Molli Posey,  MD  polyethylene glycol-electrolytes (NULYTELY) 420 g solution Use as directed 04/24/21     Prenatal Vit-Fe Fumarate-FA (PRENATAL MULTIVITAMIN) TABS Take 1 tablet by mouth daily.    [provider]    Family History Family History  Problem Relation Age of Onset   Cancer Other    Hypertension Other    Cancer Mother    Diabetes Father    Heart disease Paternal Uncle    Hypertension Paternal Uncle    Social History Social History   Tobacco Use   Smoking status: Never   Smokeless tobacco: Never  Substance Use Topics   Alcohol use: No   Drug use: No   Allergies   Cephalexin  Review of Systems Review of Systems Pertinent findings revealed after performing a 14 point review of  systems has been noted in the history of present illness.  Physical Exam Triage Vital Signs ED Triage Vitals  Enc Vitals Group     BP 12/01/20 0827 (!) 147/82     Pulse Rate 12/01/20 0827 72     Resp 12/01/20 0827 18     Temp 12/01/20 0827 98.3 F (36.8 C)     Temp Source 12/01/20 0827 Oral     SpO2 12/01/20 0827 98 %     Weight --      Height --      Head Circumference --      Peak Flow --      Pain Score 12/01/20 0826 5     Pain Loc --      Pain Edu? --      Excl. in [place]? --   No data found.  Updated Vital Signs BP (!) 149/90 (BP Location: Left Arm)   Pulse 79   Temp 98.4 F (36.9 C) (Oral)   SpO2 98%   Physical Exam Vitals and nursing note reviewed.  Constitutional:      General: She is not in acute distress.    Appearance: Normal appearance.  HENT:     Head: Normocephalic and atraumatic.  Eyes:     Pupils: Pupils are equal, round, and reactive to light.  Cardiovascular:     Rate and Rhythm: Normal rate and regular rhythm.  Pulmonary:     Effort: Pulmonary effort is normal.     Breath sounds: Normal breath sounds.  Abdominal:     General: Abdomen is flat. Bowel sounds are decreased.     Palpations: There is no shifting dullness, fluid wave, hepatomegaly, splenomegaly, mass or pulsatile mass.     Tenderness: There is abdominal tenderness in the epigastric area and left upper quadrant. There is guarding. There is no right CVA tenderness, left CVA tenderness or rebound. Negative signs include Murphy's sign, Rovsing's sign, McBurney's sign, psoas sign and obturator sign.     Hernia: No hernia is present.    Musculoskeletal:        General: Normal range of motion.     Cervical back: Normal range of motion and neck supple.  Skin:    General: Skin is warm and dry.  Neurological:     General: No focal deficit present.     Mental Status: She is alert and oriented to person, place, and time. Mental status is at baseline.  Psychiatric:        Mood and Affect: Mood  normal.        Behavior: Behavior normal.        Thought Content: Thought content normal.  Judgment: Judgment normal.     Visual Acuity Right Eye Distance:   Left Eye Distance:   Bilateral Distance:    Right Eye Near:   Left Eye Near:    Bilateral Near:     UC Couse / Diagnostics / Procedures:     Radiology DG Abdomen 1 View  Result Date: 02/25/2022 CLINICAL DATA:  Epigastric and left upper quadrant pain EXAM: ABDOMEN - 1 VIEW COMPARISON:  None Available. FINDINGS: The bowel gas pattern is normal. No radio-opaque calculi or other significant radiographic abnormality are seen. Surgical clips in the right upper quadrant. Lung bases are clear. IMPRESSION: No radiographic finding to explain left upper quadrant pain. Electronically Signed   By: Marin Roberts M.D.   On: 02/25/2022 12:44    Procedures Procedures (including critical care time) EKG  Pending results:  Labs Reviewed - No data to display  Medications Ordered in UC: Medications - No data to display  UC Diagnoses / Final Clinical Impressions(s)   I have reviewed the triage vital signs and the nursing notes.  Pertinent labs & imaging results that were available during my care of the patient were reviewed by me and considered in my medical decision making (see chart for details).    Final diagnoses:  Constipation, unspecified constipation type   Radiology findings shared with patient.  Recommend magnesium citrate for bowel cleanout.  After complete, recommend daily MiraLAX for the next 30 to 60 days to help improve bowel tone.  Patient advised to follow-up in the next 3 to 5 days if not feeling any better.  Please see discharge instructions below for details of plan of care as provided to patient. ED Prescriptions     Medication Sig Dispense Auth. Provider   polyethylene glycol powder (MIRALAX) 17 GM/SCOOP powder Take 17 g by mouth in the morning. 510 g Lynden Oxford Scales, PA-C      PDMP not reviewed  this encounter.  Pending results:  Labs Reviewed - No data to display  Discharge Instructions:   Discharge Instructions      I recommend that you purchase a bottle of magnesium citrate and use it as directed to clear your bowels.  I also recommend that you continue the bowel regimen of MiraLAX, 1 capful mixed into 8 ounces of your favorite beverage daily in the morning to keep your bowels soft and to help the muscle tone of your intestines to return to normal.  Please follow-up in the next 3 to 5 days if you are not feeling any better.  Thank you for visiting urgent care today.      Disposition Upon Discharge:  Condition: stable for discharge home  Patient presented with an acute illness with associated systemic symptoms and significant discomfort requiring urgent management. In my opinion, this is a condition that a prudent lay person (someone who possesses an average knowledge of health and medicine) may potentially expect to result in complications if not addressed urgently such as respiratory distress, impairment of bodily function or dysfunction of bodily organs.   Routine symptom specific, illness specific and/or disease specific instructions were discussed with the patient and/or caregiver at length.   As such, the patient has been evaluated and assessed, work-up was performed and treatment was provided in alignment with urgent care protocols and evidence based medicine.  Patient/parent/caregiver has been advised that the patient may require follow up for further testing and treatment if the symptoms continue in spite of treatment, as clinically indicated and appropriate.  Patient/parent/caregiver  has been advised to return to the [facility] or PCP if no better; to PCP or the Emergency Department if new signs and symptoms develop, or if the current signs or symptoms continue to change or worsen for further workup, evaluation and treatment as clinically indicated and appropriate  The  patient will follow up with their current PCP if and as advised. If the patient does not currently have a PCP we will assist them in obtaining one.   The patient may need specialty follow up if the symptoms continue, in spite of conservative treatment and management, for further workup, evaluation, consultation and treatment as clinically indicated and appropriate.  Patient/parent/caregiver verbalized understanding and agreement of plan as discussed.  All questions were addressed during visit.  Please see discharge instructions below for further details of plan.  This office note has been dictated using Museum/gallery curator.  Unfortunately, this method of dictation can sometimes lead to typographical or grammatical errors.  I apologize for your inconvenience in advance if this occurs.  Please do not hesitate to reach out to me if clarification is needed.      Lynden Oxford Scales, PA-C 02/25/22 1425

## 2022-02-27 DIAGNOSIS — Z6830 Body mass index (BMI) 30.0-30.9, adult: Secondary | ICD-10-CM | POA: Diagnosis not present

## 2022-02-27 DIAGNOSIS — Z13220 Encounter for screening for lipoid disorders: Secondary | ICD-10-CM | POA: Diagnosis not present

## 2022-02-27 DIAGNOSIS — Z131 Encounter for screening for diabetes mellitus: Secondary | ICD-10-CM | POA: Diagnosis not present

## 2022-02-27 DIAGNOSIS — Z713 Dietary counseling and surveillance: Secondary | ICD-10-CM | POA: Diagnosis not present

## 2022-02-27 DIAGNOSIS — Z13228 Encounter for screening for other metabolic disorders: Secondary | ICD-10-CM | POA: Diagnosis not present

## 2022-03-05 ENCOUNTER — Other Ambulatory Visit: Payer: Self-pay

## 2022-03-05 ENCOUNTER — Other Ambulatory Visit (HOSPITAL_COMMUNITY): Payer: Self-pay

## 2022-04-05 ENCOUNTER — Other Ambulatory Visit (HOSPITAL_COMMUNITY): Payer: Self-pay

## 2022-04-09 ENCOUNTER — Other Ambulatory Visit (HOSPITAL_COMMUNITY): Payer: Self-pay

## 2022-04-11 ENCOUNTER — Other Ambulatory Visit (HOSPITAL_COMMUNITY): Payer: Self-pay

## 2022-04-11 MED ORDER — WEGOVY 0.25 MG/0.5ML ~~LOC~~ SOAJ
0.2500 mg | SUBCUTANEOUS | 1 refills | Status: DC
  Filled 2022-04-11 – 2022-04-12 (×2): qty 2, 28d supply, fill #0

## 2022-04-12 ENCOUNTER — Other Ambulatory Visit (HOSPITAL_COMMUNITY): Payer: Self-pay

## 2022-04-12 ENCOUNTER — Other Ambulatory Visit: Payer: Self-pay

## 2022-04-17 ENCOUNTER — Other Ambulatory Visit (HOSPITAL_COMMUNITY): Payer: Self-pay

## 2022-04-17 DIAGNOSIS — L718 Other rosacea: Secondary | ICD-10-CM | POA: Diagnosis not present

## 2022-04-17 DIAGNOSIS — D2271 Melanocytic nevi of right lower limb, including hip: Secondary | ICD-10-CM | POA: Diagnosis not present

## 2022-04-17 DIAGNOSIS — D225 Melanocytic nevi of trunk: Secondary | ICD-10-CM | POA: Diagnosis not present

## 2022-04-17 DIAGNOSIS — L218 Other seborrheic dermatitis: Secondary | ICD-10-CM | POA: Diagnosis not present

## 2022-04-17 DIAGNOSIS — D2272 Melanocytic nevi of left lower limb, including hip: Secondary | ICD-10-CM | POA: Diagnosis not present

## 2022-04-17 DIAGNOSIS — Z129 Encounter for screening for malignant neoplasm, site unspecified: Secondary | ICD-10-CM | POA: Diagnosis not present

## 2022-04-17 MED ORDER — METRONIDAZOLE 0.75 % EX CREA
1.0000 | TOPICAL_CREAM | Freq: Two times a day (BID) | CUTANEOUS | 11 refills | Status: AC
  Filled 2022-04-17: qty 45, 23d supply, fill #0
  Filled 2022-08-18: qty 45, 23d supply, fill #1

## 2022-05-15 ENCOUNTER — Other Ambulatory Visit (HOSPITAL_COMMUNITY): Payer: Self-pay

## 2022-05-15 DIAGNOSIS — Z6829 Body mass index (BMI) 29.0-29.9, adult: Secondary | ICD-10-CM | POA: Diagnosis not present

## 2022-05-15 DIAGNOSIS — Z713 Dietary counseling and surveillance: Secondary | ICD-10-CM | POA: Diagnosis not present

## 2022-05-15 MED ORDER — VALACYCLOVIR HCL 1 G PO TABS
2000.0000 mg | ORAL_TABLET | Freq: Two times a day (BID) | ORAL | 3 refills | Status: AC
  Filled 2022-05-15: qty 4, 1d supply, fill #0

## 2022-05-20 DIAGNOSIS — Z1231 Encounter for screening mammogram for malignant neoplasm of breast: Secondary | ICD-10-CM | POA: Diagnosis not present

## 2022-05-22 ENCOUNTER — Other Ambulatory Visit: Payer: Self-pay | Admitting: Obstetrics and Gynecology

## 2022-05-22 DIAGNOSIS — Z803 Family history of malignant neoplasm of breast: Secondary | ICD-10-CM

## 2022-05-27 ENCOUNTER — Other Ambulatory Visit: Payer: Self-pay

## 2022-07-04 DIAGNOSIS — Z6827 Body mass index (BMI) 27.0-27.9, adult: Secondary | ICD-10-CM | POA: Diagnosis not present

## 2022-07-04 DIAGNOSIS — Z713 Dietary counseling and surveillance: Secondary | ICD-10-CM | POA: Diagnosis not present

## 2022-08-14 ENCOUNTER — Other Ambulatory Visit: Payer: Self-pay | Admitting: Oncology

## 2022-08-14 DIAGNOSIS — Z006 Encounter for examination for normal comparison and control in clinical research program: Secondary | ICD-10-CM

## 2022-09-19 DIAGNOSIS — Z713 Dietary counseling and surveillance: Secondary | ICD-10-CM | POA: Diagnosis not present

## 2022-09-19 DIAGNOSIS — Z6825 Body mass index (BMI) 25.0-25.9, adult: Secondary | ICD-10-CM | POA: Diagnosis not present

## 2022-10-22 ENCOUNTER — Encounter: Payer: Self-pay | Admitting: Family Medicine

## 2022-10-22 ENCOUNTER — Ambulatory Visit: Payer: Commercial Managed Care - PPO | Admitting: Family Medicine

## 2022-10-22 VITALS — BP 123/71 | HR 65 | Temp 98.0°F | Resp 18 | Ht 64.0 in | Wt 140.8 lb

## 2022-10-22 DIAGNOSIS — L719 Rosacea, unspecified: Secondary | ICD-10-CM | POA: Diagnosis not present

## 2022-10-22 DIAGNOSIS — K219 Gastro-esophageal reflux disease without esophagitis: Secondary | ICD-10-CM | POA: Diagnosis not present

## 2022-10-22 DIAGNOSIS — Z8742 Personal history of other diseases of the female genital tract: Secondary | ICD-10-CM | POA: Insufficient documentation

## 2022-10-22 DIAGNOSIS — Z7689 Persons encountering health services in other specified circumstances: Secondary | ICD-10-CM

## 2022-10-22 NOTE — Progress Notes (Signed)
New Patient Office Visit  Subjective    Patient ID: Tiffany Paul, female    DOB: Apr 29, 1975  Age: 47 y.o. MRN: [id number]  CC:  Chief Complaint  Patient presents with   Establish Care    Patient is here to establish care with new PCP    HPI Tiffany Paul presents to establish care. Pt is new to me.  She was seeing [facility]. Her Gyn provider has her on Semaglutide 20 units maintenance dose weekly. She had a starting weight of 170.4. She does also meet with her nutritionist regarding the weight maintenance. Next appt is in November 2024 She's had her pap smear and colonoscopy through her Gynecologist. Will upload these results to her mychart. She has GERD with the Semaglutide injection and takes Pepcid 20 mg daily. Pt has hx of rosacea and using Metrogel 4 times a week but not daily.  She has family hx of breast cancer in her mother who was diagnosed in her late 44s. She's had negative genetic testing. She is on dual schedule with mammogram and breast MRI 6 months later.    Outpatient Encounter Medications as of 10/22/2022  Medication Sig   Calcium Carbonate Antacid (TUMS PO) Take 1-2 tablets by mouth daily as needed. For heartburn   famotidine (PEPCID) 20 MG tablet Take 20 mg by mouth daily.   metroNIDAZOLE (METROCREAM) 0.75 % cream Apply 1 Application topically 2 (two) times daily.   norethindrone-ethinyl estradiol-FE (BLISOVI FE 1/20) 1-20 MG-MCG tablet TAKE 1 TABLET BY MOUTH EVERY DAY   Semaglutide, 1 MG/DOSE, 2 MG/1.5ML SOPN    valACYclovir (VALTREX) 1000 MG tablet Take 2 tablets (2,000 mg total) by mouth every 12 (twelve) hours, for 1 day, for cold sores.   [DISCONTINUED] norethindrone-ethinyl estradiol (MICROGESTIN FE 1/20) 1-20 MG-MCG tablet    [DISCONTINUED] norethindrone-ethinyl estradiol-FE (BLISOVI FE 1/20) 1-20 MG-MCG tablet Take 1 tablet by mouth daily.   [DISCONTINUED] norethindrone-ethinyl estradiol-FE (LOESTRIN FE) 1-20 MG-MCG tablet TAKE 1 TABLET  BY MOUTH EVERY DAY   [DISCONTINUED] phentermine 30 MG capsule TAKE 1 CAPSULE EVERY DAY BY MOUTH IN THE MORNING   [DISCONTINUED] Prenatal Vit-Fe Fumarate-FA (PRENATAL MULTIVITAMIN) TABS Take 1 tablet by mouth daily.   No facility-administered encounter medications on file as of 10/22/2022.    Past Medical History:  Diagnosis Date   Endometriosis    GERD (gastroesophageal reflux disease)    On and off for years   Gestational diabetes    Heart murmur    As a child    Past Surgical History:  Procedure Laterality Date   CESAREAN SECTION  05/15/2011   Procedure: CESAREAN SECTION;  Surgeon: Meriel Pica, MD;  Location: [facility];  Service: Gynecology;  Laterality: N/A;  Primary Cesarean Section Delivery Boy @ 2254, Apgars 9/9   CHOLECYSTECTOMY     EXPLORATORY LAPAROTOMY     For endometrosis   WISDOM TOOTH EXTRACTION      Family History  Problem Relation Age of Onset   Cancer Other    Hypertension Other    Cancer Mother    Diabetes Father    Heart disease Paternal Uncle    Hypertension Paternal Uncle    Heart disease Paternal Grandfather     Social History   Socioeconomic History   Marital status: Married    Spouse name: Not on file   Number of children: Not on file   Years of education: Not on file   Highest education level: Master's degree (e.g., MA, MS,  MEng, MEd, MSW, MBA)  Occupational History   Not on file  Tobacco Use   Smoking status: Former    Current packs/day: 0.00    Average packs/day: 0.5 packs/day for 10.0 years (5.0 ttl pk-yrs)    Types: Cigarettes    Quit date: 02/05/2004    Years since quitting: 18.7    Passive exposure: Never   Smokeless tobacco: Never   Tobacco comments:    Smoked for a few years after college then quit  Vaping Use   Vaping status: Never Used  Substance and Sexual Activity   Alcohol use: Yes    Alcohol/week: 2.0 standard drinks of alcohol    Types: 1 Glasses of wine, 1 Cans of beer per week    Comment: Not really weekly    Drug use: No   Sexual activity: Yes    Birth control/protection: Pill  Other Topics Concern   Not on file  Social History Narrative   Not on file   Social Determinants of Health   Financial Resource Strain: Low Risk  (10/18/2022)   Overall Financial Resource Strain (CARDIA)    Difficulty of Paying Living Expenses: Not hard at all  Food Insecurity: No Food Insecurity (10/18/2022)   Hunger Vital Sign    Worried About Running Out of Food in the Last Year: Never true    Ran Out of Food in the Last Year: Never true  Transportation Needs: No Transportation Needs (10/18/2022)   PRAPARE - Administrator, Civil Service (Medical): No    Lack of Transportation (Non-Medical): No  Physical Activity: Insufficiently Active (10/18/2022)   Exercise Vital Sign    Days of Exercise per Week: 4 days    Minutes of Exercise per Session: 30 min  Stress: No Stress Concern Present (10/18/2022)   [employer] of Occupational Health - Occupational Stress Questionnaire    Feeling of Stress : Only a little  Social Connections: Moderately Integrated (10/18/2022)   Social Connection and Isolation Panel [NHANES]    Frequency of Communication with Friends and Family: Twice a week    Frequency of Social Gatherings with Friends and Family: Once a week    Attends Religious Services: More than 4 times per year    Active Member of [employer] or Organizations: No    Attends Engineer, structural: Not on file    Marital Status: Married  Catering manager Violence: Not on file    Review of Systems  All other systems reviewed and are negative.      Objective    BP 123/71   Pulse 65   Temp 98 F (36.7 C) (Oral)   Resp 18   Ht 5\' 4"  (1.626 m)   Wt 140 lb 12.8 oz (63.9 kg)   LMP 10/16/2022 (Exact Date)   SpO2 99%   Breastfeeding Unknown   BMI 24.17 kg/m   Physical Exam Vitals and nursing note reviewed.  Constitutional:      Appearance: Normal appearance. She is normal weight.  HENT:      Head: Normocephalic and atraumatic.     Right Ear: External ear normal.     Left Ear: External ear normal.     Nose: Nose normal.     Mouth/Throat:     Mouth: Mucous membranes are moist.  Eyes:     Extraocular Movements: Extraocular movements intact.  Cardiovascular:     Rate and Rhythm: Normal rate.  Pulmonary:     Effort: Pulmonary effort is normal.  Neurological:  General: No focal deficit present.     Mental Status: She is alert and oriented to person, place, and time. Mental status is at baseline.  Psychiatric:        Mood and Affect: Mood normal.        Behavior: Behavior normal.        Thought Content: Thought content normal.        Judgment: Judgment normal.       Assessment & Plan:   Problem List Items Addressed This Visit   None Encounter to establish care with new doctor  Gastroesophageal reflux disease without esophagitis  Rosacea   Stable chronic conditions. Continue routine health maintenance with GYN.  See me prn.  No follow-ups on file.   Suzan Slick, MD  Total time spent with patient today 42 minutes. This includes reviewing records, evaluating the patient and coordinating care. Face-to-face time >50%.

## 2022-11-04 ENCOUNTER — Other Ambulatory Visit (HOSPITAL_COMMUNITY): Payer: Self-pay

## 2022-11-15 ENCOUNTER — Encounter: Payer: Self-pay | Admitting: Obstetrics and Gynecology

## 2022-11-20 ENCOUNTER — Ambulatory Visit
Admission: RE | Admit: 2022-11-20 | Discharge: 2022-11-20 | Disposition: A | Discharge status: Home / Self Care | Payer: Commercial Managed Care - PPO | Source: Ambulatory Visit | Attending: Obstetrics and Gynecology | Admitting: Obstetrics and Gynecology

## 2022-11-20 DIAGNOSIS — Z1239 Encounter for other screening for malignant neoplasm of breast: Secondary | ICD-10-CM | POA: Diagnosis not present

## 2022-11-20 DIAGNOSIS — Z803 Family history of malignant neoplasm of breast: Secondary | ICD-10-CM

## 2022-11-20 MED ORDER — GADOPICLENOL 0.5 MMOL/ML IV SOLN
6.0000 mL | Freq: Once | INTRAVENOUS | Status: AC | PRN
  Administered 2022-11-20: 6 mL via INTRAVENOUS

## 2022-11-22 IMAGING — MR MR BREAST BILAT WO/W CM
8 of 12 series · 33 of 48 positions shown · IV contrast (gadavist)
Comparison: Previous exam(s).

CLINICAL DATA: Patient with elevated lifetime risk of breast
cancer. Strong family history. High risk screening MRI.

EXAM:
BILATERAL BREAST MRI WITH AND WITHOUT CONTRAST
TECHNIQUE: Multiplanar, multisequence MR images of both breasts were obtained
prior to and following the intravenous administration of 9 ml of
Gadavist

[Series 2: t2_tirm_tra ipat (a-p) · axial · 3.0mm · 0.70mm/px · 1 of 55 slices shown]
[im 1/55]
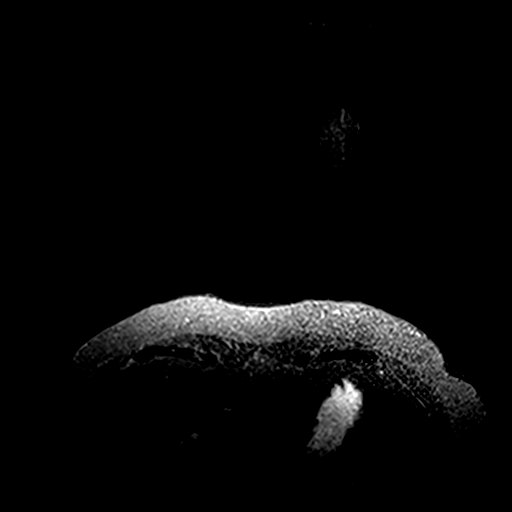

[Series 3: fl3d pre-cm no · axial · non-contrast · 1.2mm · 0.89mm/px · z∈[-59,+113]mm · 5 of 144 slices shown]
[im 1/144]
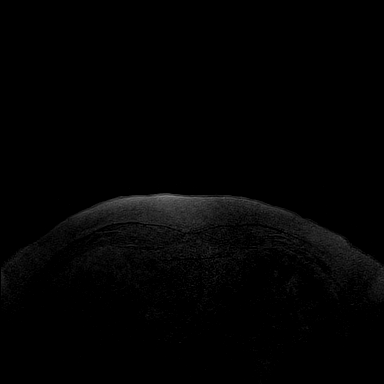
[im 36/144]
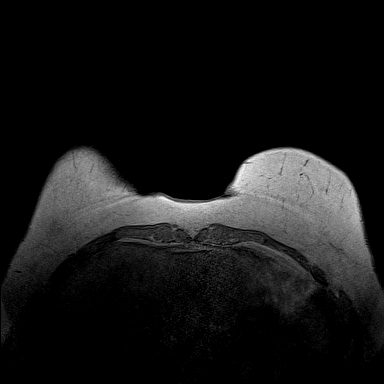
[im 72/144]
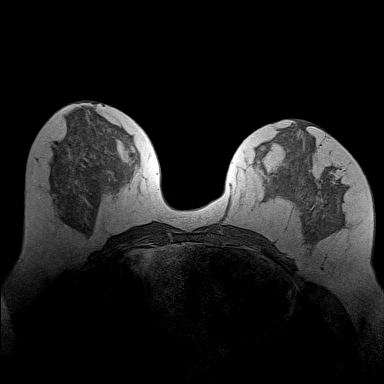
[im 108/144]
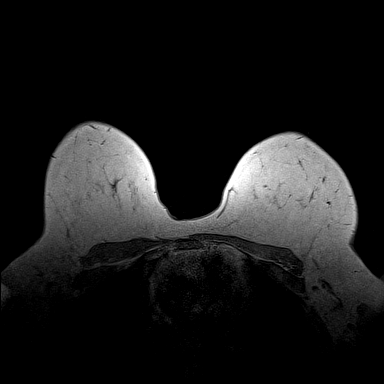
[im 144/144]
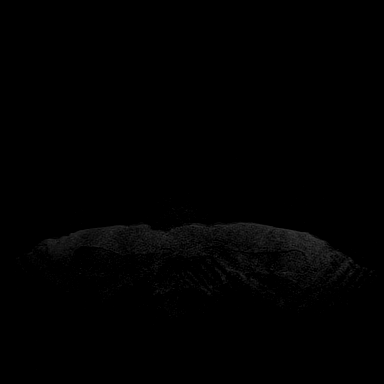

[Series 4: fl3d pre-cm · axial · non-contrast · 1.2mm · 0.89mm/px · z∈[-59,+113]mm · 5 of 144 slices shown]
[im 1/144]
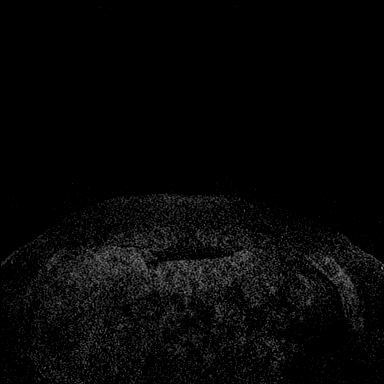
[im 36/144]
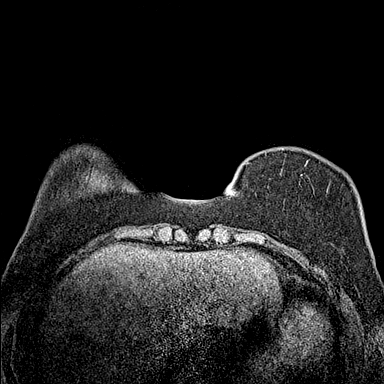
[im 72/144]
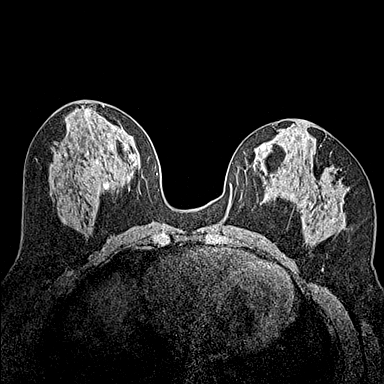
[im 108/144]
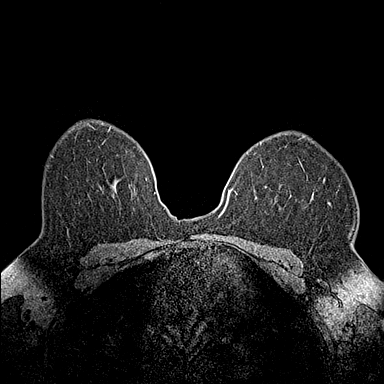
[im 144/144]
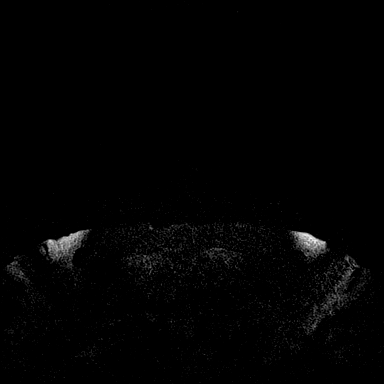

[Series 5: fl3d post-cm 20 · axial · 1.2mm · 0.89mm/px · z∈[-59,+113]mm · 5 of 144 slices shown (1 of 3)]
[im 1/144]
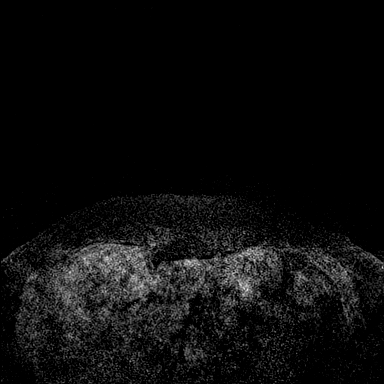
[im 36/144]
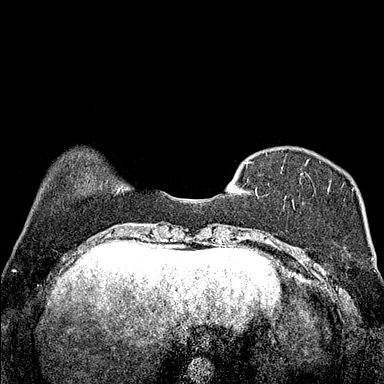
[im 72/144]
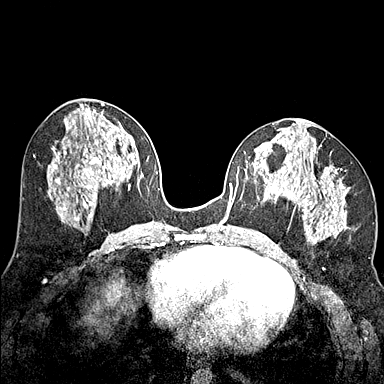
[im 108/144]
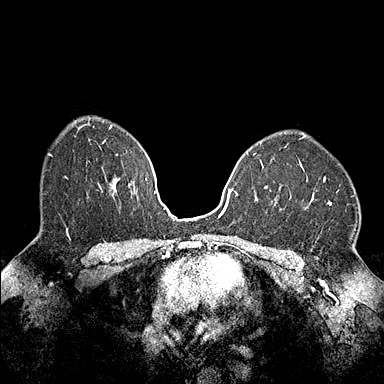
[im 144/144]
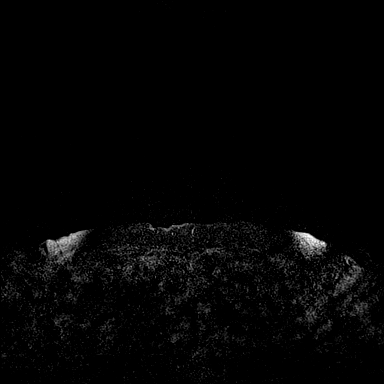

[Series 6: fl3d post-cm 20 · axial · 1.2mm · 0.89mm/px · z∈[-59,+113]mm · 5 of 144 slices shown (2 of 3)]
[im 1/144]
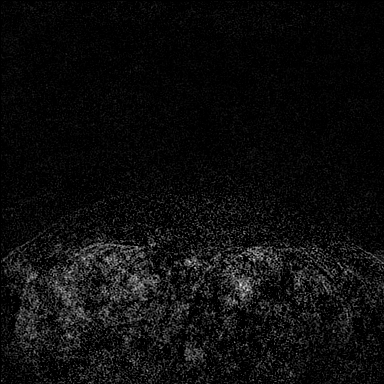
[im 36/144]
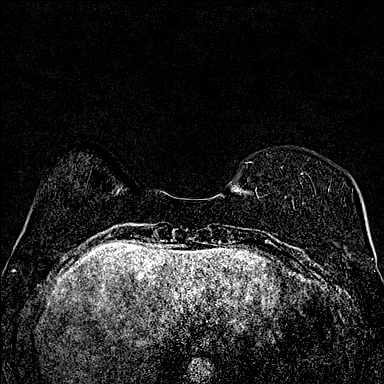
[im 72/144]
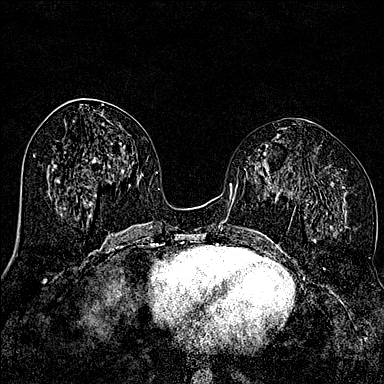
[im 108/144]
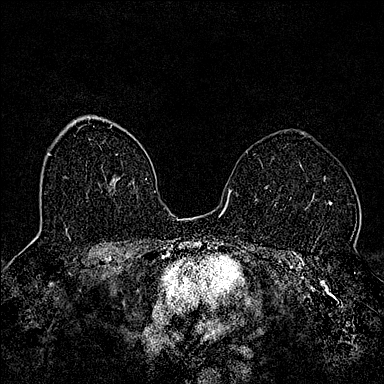
[im 144/144]
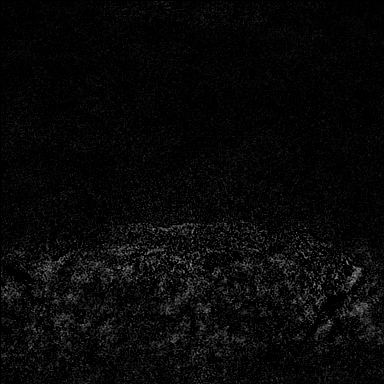

[Series 7: fl3d post-cm 20 · axial · 172.8mm · 0.89mm/px · 1 of 1 slices shown (3 of 3)]
[im 1/1]
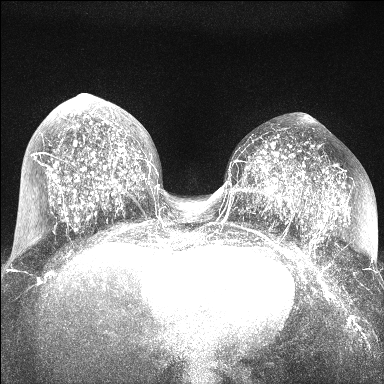

[Series 8: fl3d post-cm 3 · axial · 1.2mm · 0.89mm/px · z∈[-59,+113]mm · 6 of 144 slices shown (1 of 2)]
[im 1/144]
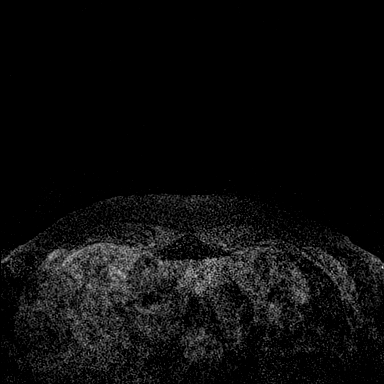
[im 29/144]
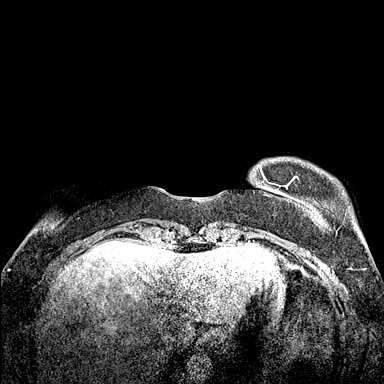
[im 58/144]
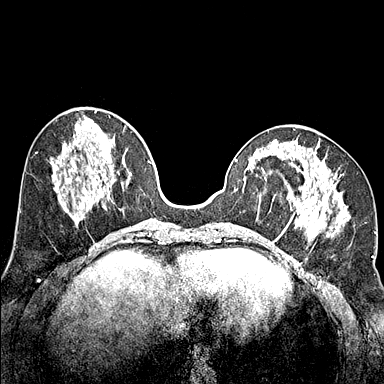
[im 86/144]
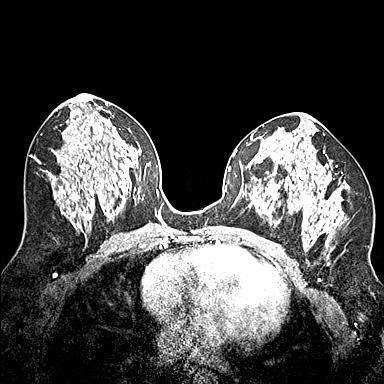
[im 115/144]
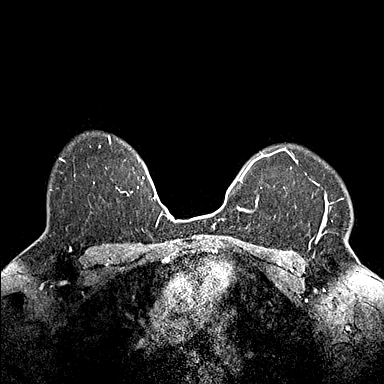
[im 144/144]
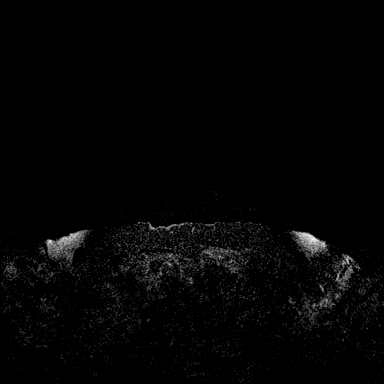

[Series 9: fl3d post-cm 3 · axial · 1.2mm · 0.89mm/px · z∈[-59,+78]mm · 5 of 144 slices shown (2 of 2)]
[im 1/144]
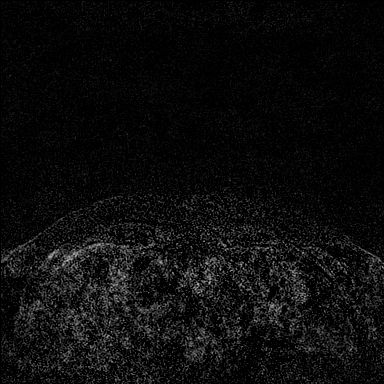
[im 29/144]
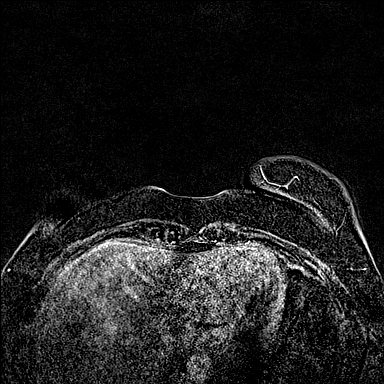
[im 58/144]
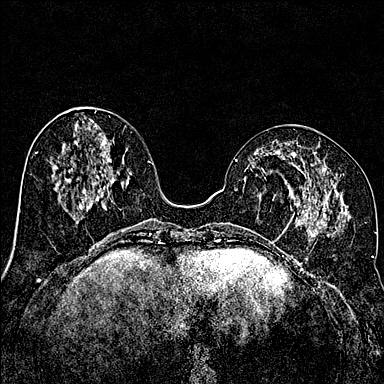
[im 86/144]
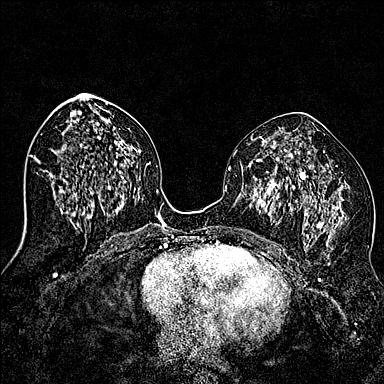
[im 115/144]
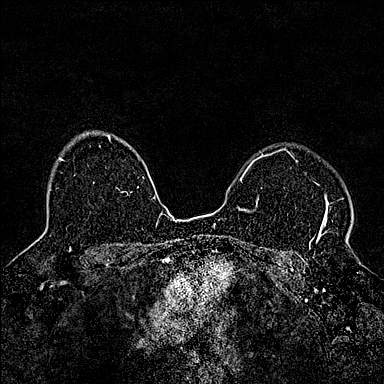

[33 of 48 positions shown; findings below may reference images not displayed]

Three-dimensional MR images were rendered by post-processing of the
original MR data on an independent workstation. The
three-dimensional MR images were interpreted, and findings are
reported in the following complete MRI report for this study. Three
dimensional images were evaluated at the independent interpreting
workstation using the DynaCAD thin client.
FINDINGS: Breast composition: c. Heterogeneous fibroglandular tissue.

Background parenchymal enhancement: Moderate.

Right breast: No mass or abnormal enhancement.

Left breast: No mass or abnormal enhancement.

Lymph nodes: No abnormal appearing lymph nodes.

Ancillary findings:  None.
IMPRESSION: No MRI evidence to suggest malignancy within either breast. Moderate
background parenchymal enhancement.

RECOMMENDATION:
Annual high risk screening MRI.

Annual screening mammogram.  Screening mammography due [DATE].

BI-RADS CATEGORY  2: Benign.

## 2022-12-25 DIAGNOSIS — Z6823 Body mass index (BMI) 23.0-23.9, adult: Secondary | ICD-10-CM | POA: Diagnosis not present

## 2022-12-25 DIAGNOSIS — Z713 Dietary counseling and surveillance: Secondary | ICD-10-CM | POA: Diagnosis not present

## 2023-01-20 DIAGNOSIS — Z6823 Body mass index (BMI) 23.0-23.9, adult: Secondary | ICD-10-CM | POA: Diagnosis not present

## 2023-01-20 DIAGNOSIS — Z01419 Encounter for gynecological examination (general) (routine) without abnormal findings: Secondary | ICD-10-CM | POA: Diagnosis not present

## 2023-02-07 ENCOUNTER — Other Ambulatory Visit (HOSPITAL_COMMUNITY): Payer: Self-pay

## 2023-02-10 ENCOUNTER — Other Ambulatory Visit (HOSPITAL_COMMUNITY): Payer: Self-pay

## 2023-02-10 MED ORDER — NORETHIN ACE-ETH ESTRAD-FE 1-20 MG-MCG PO TABS
1.0000 | ORAL_TABLET | Freq: Every day | ORAL | 4 refills | Status: DC
  Filled 2023-02-10: qty 84, 84d supply, fill #0
  Filled 2023-04-26: qty 84, 84d supply, fill #1
  Filled 2023-07-23: qty 84, 84d supply, fill #2
  Filled 2023-10-08: qty 84, 84d supply, fill #3
  Filled 2024-01-04: qty 84, 84d supply, fill #4

## 2023-03-20 DIAGNOSIS — Z6823 Body mass index (BMI) 23.0-23.9, adult: Secondary | ICD-10-CM | POA: Diagnosis not present

## 2023-03-20 DIAGNOSIS — Z713 Dietary counseling and surveillance: Secondary | ICD-10-CM | POA: Diagnosis not present

## 2023-04-17 DIAGNOSIS — Z129 Encounter for screening for malignant neoplasm, site unspecified: Secondary | ICD-10-CM | POA: Diagnosis not present

## 2023-04-17 DIAGNOSIS — L718 Other rosacea: Secondary | ICD-10-CM | POA: Diagnosis not present

## 2023-04-17 DIAGNOSIS — D2261 Melanocytic nevi of right upper limb, including shoulder: Secondary | ICD-10-CM | POA: Diagnosis not present

## 2023-04-17 DIAGNOSIS — D2262 Melanocytic nevi of left upper limb, including shoulder: Secondary | ICD-10-CM | POA: Diagnosis not present

## 2023-04-17 DIAGNOSIS — D225 Melanocytic nevi of trunk: Secondary | ICD-10-CM | POA: Diagnosis not present

## 2023-04-26 ENCOUNTER — Other Ambulatory Visit (HOSPITAL_COMMUNITY): Payer: Self-pay

## 2023-04-29 ENCOUNTER — Other Ambulatory Visit (HOSPITAL_COMMUNITY): Payer: Self-pay

## 2023-04-29 MED ORDER — METRONIDAZOLE 0.75 % EX CREA
1.0000 | TOPICAL_CREAM | Freq: Two times a day (BID) | CUTANEOUS | 11 refills | Status: AC
  Filled 2023-04-29: qty 45, 30d supply, fill #0

## 2023-05-03 ENCOUNTER — Other Ambulatory Visit (HOSPITAL_COMMUNITY): Payer: Self-pay

## 2023-05-13 ENCOUNTER — Other Ambulatory Visit (HOSPITAL_COMMUNITY): Payer: Self-pay

## 2023-06-18 DIAGNOSIS — Z713 Dietary counseling and surveillance: Secondary | ICD-10-CM | POA: Diagnosis not present

## 2023-06-18 DIAGNOSIS — Z6823 Body mass index (BMI) 23.0-23.9, adult: Secondary | ICD-10-CM | POA: Diagnosis not present

## 2023-06-18 DIAGNOSIS — Z1231 Encounter for screening mammogram for malignant neoplasm of breast: Secondary | ICD-10-CM | POA: Diagnosis not present

## 2023-06-19 ENCOUNTER — Other Ambulatory Visit (HOSPITAL_COMMUNITY): Payer: Self-pay

## 2023-06-19 ENCOUNTER — Encounter (HOSPITAL_COMMUNITY): Payer: Self-pay

## 2023-06-19 MED ORDER — ZEPBOUND 2.5 MG/0.5ML ~~LOC~~ SOAJ
2.5000 mg | SUBCUTANEOUS | 2 refills | Status: DC
  Filled 2023-06-19: qty 2, 28d supply, fill #0

## 2023-07-21 ENCOUNTER — Other Ambulatory Visit: Payer: Self-pay | Admitting: Obstetrics and Gynecology

## 2023-07-21 DIAGNOSIS — Z9189 Other specified personal risk factors, not elsewhere classified: Secondary | ICD-10-CM

## 2023-07-24 ENCOUNTER — Other Ambulatory Visit (HOSPITAL_COMMUNITY): Payer: Self-pay

## 2023-07-24 MED ORDER — BISACODYL 5 MG PO TBEC
DELAYED_RELEASE_TABLET | ORAL | 0 refills | Status: AC
  Filled 2023-07-24: qty 4, 1d supply, fill #0

## 2023-07-24 MED ORDER — PEG 3350-KCL-NA BICARB-NACL 420 G PO SOLR
ORAL | 0 refills | Status: AC
  Filled 2023-07-24: qty 4000, 1d supply, fill #0

## 2023-08-05 DIAGNOSIS — Z860101 Personal history of adenomatous and serrated colon polyps: Secondary | ICD-10-CM | POA: Diagnosis not present

## 2023-08-05 DIAGNOSIS — K6289 Other specified diseases of anus and rectum: Secondary | ICD-10-CM | POA: Diagnosis not present

## 2023-08-05 DIAGNOSIS — Z09 Encounter for follow-up examination after completed treatment for conditions other than malignant neoplasm: Secondary | ICD-10-CM | POA: Diagnosis not present

## 2023-10-08 ENCOUNTER — Other Ambulatory Visit: Payer: Self-pay

## 2023-11-07 ENCOUNTER — Other Ambulatory Visit (HOSPITAL_BASED_OUTPATIENT_CLINIC_OR_DEPARTMENT_OTHER): Payer: Self-pay

## 2023-11-07 MED ORDER — FLUZONE 0.5 ML IM SUSY
0.5000 mL | PREFILLED_SYRINGE | Freq: Once | INTRAMUSCULAR | 0 refills | Status: AC
  Filled 2023-11-07: qty 0.5, 1d supply, fill #0

## 2023-11-24 ENCOUNTER — Other Ambulatory Visit (HOSPITAL_BASED_OUTPATIENT_CLINIC_OR_DEPARTMENT_OTHER): Payer: Self-pay

## 2023-12-05 ENCOUNTER — Other Ambulatory Visit: Payer: Self-pay | Admitting: Medical Genetics

## 2023-12-05 DIAGNOSIS — Z006 Encounter for examination for normal comparison and control in clinical research program: Secondary | ICD-10-CM

## 2023-12-18 ENCOUNTER — Ambulatory Visit
Admission: RE | Admit: 2023-12-18 | Discharge: 2023-12-18 | Disposition: A | Discharge status: Home / Self Care | Source: Ambulatory Visit | Attending: Obstetrics and Gynecology | Admitting: Obstetrics and Gynecology

## 2023-12-18 DIAGNOSIS — Z9189 Other specified personal risk factors, not elsewhere classified: Secondary | ICD-10-CM

## 2023-12-18 DIAGNOSIS — N6313 Unspecified lump in the right breast, lower outer quadrant: Secondary | ICD-10-CM | POA: Diagnosis not present

## 2023-12-18 DIAGNOSIS — Z1239 Encounter for other screening for malignant neoplasm of breast: Secondary | ICD-10-CM | POA: Diagnosis not present

## 2023-12-18 MED ORDER — GADOPICLENOL 0.5 MMOL/ML IV SOLN
6.0000 mL | Freq: Once | INTRAVENOUS | Status: AC | PRN
  Administered 2023-12-18: 6 mL via INTRAVENOUS

## 2023-12-19 ENCOUNTER — Other Ambulatory Visit: Payer: Self-pay | Admitting: Obstetrics and Gynecology

## 2023-12-19 DIAGNOSIS — R928 Other abnormal and inconclusive findings on diagnostic imaging of breast: Secondary | ICD-10-CM

## 2023-12-24 ENCOUNTER — Ambulatory Visit
Admission: RE | Admit: 2023-12-24 | Discharge: 2023-12-24 | Disposition: A | Discharge status: Home / Self Care | Source: Ambulatory Visit | Attending: Obstetrics and Gynecology | Admitting: Obstetrics and Gynecology

## 2023-12-24 DIAGNOSIS — R928 Other abnormal and inconclusive findings on diagnostic imaging of breast: Secondary | ICD-10-CM

## 2023-12-24 DIAGNOSIS — N6313 Unspecified lump in the right breast, lower outer quadrant: Secondary | ICD-10-CM | POA: Diagnosis not present

## 2023-12-24 MED ORDER — GADOPICLENOL 0.5 MMOL/ML IV SOLN
6.0000 mL | Freq: Once | INTRAVENOUS | Status: AC | PRN
  Administered 2023-12-24: 6 mL via INTRAVENOUS

## 2023-12-25 LAB — SURGICAL PATHOLOGY

## 2024-01-10 LAB — GENECONNECT MOLECULAR SCREEN: Genetic Analysis Overall Interpretation: NEGATIVE

## 2024-01-26 ENCOUNTER — Other Ambulatory Visit (HOSPITAL_COMMUNITY): Payer: Self-pay

## 2024-01-26 DIAGNOSIS — Z6823 Body mass index (BMI) 23.0-23.9, adult: Secondary | ICD-10-CM | POA: Diagnosis not present

## 2024-01-26 DIAGNOSIS — Z01419 Encounter for gynecological examination (general) (routine) without abnormal findings: Secondary | ICD-10-CM | POA: Diagnosis not present

## 2024-01-26 DIAGNOSIS — Z760 Encounter for issue of repeat prescription: Secondary | ICD-10-CM | POA: Diagnosis not present

## 2024-01-26 MED ORDER — TARINA FE 1/20 EQ 1-20 MG-MCG PO TABS
1.0000 | ORAL_TABLET | Freq: Every day | ORAL | 4 refills | Status: AC
  Filled 2024-01-26: qty 84, 84d supply, fill #0
